# Patient Record
Sex: Male | Born: 1955 | Race: White | Hispanic: No | Smoking: Never smoker
Health system: Southern US, Community
[De-identification: ages and names within clinical notes are randomized; demographics above are authoritative.]

## PROBLEM LIST (undated history)

## (undated) DIAGNOSIS — C9110 Chronic lymphocytic leukemia of B-cell type not having achieved remission: Principal | ICD-10-CM

## (undated) DIAGNOSIS — C9590 Leukemia, unspecified not having achieved remission: Secondary | ICD-10-CM

## (undated) HISTORY — PX: OTHER SURGICAL HISTORY: SHX169

## (undated) HISTORY — DX: Leukemia, unspecified not having achieved remission: C95.90

## (undated) HISTORY — DX: Chronic lymphocytic leukemia of B-cell type not having achieved remission: C91.10

---

## 2004-08-10 ENCOUNTER — Encounter: Admission: RE | Admit: 2004-08-10 | Discharge: 2004-08-10 | Payer: Self-pay | Admitting: Internal Medicine

## 2005-07-01 ENCOUNTER — Encounter: Admission: RE | Admit: 2005-07-01 | Discharge: 2005-07-01 | Payer: Self-pay | Admitting: Internal Medicine

## 2007-02-19 ENCOUNTER — Ambulatory Visit: Payer: Self-pay | Admitting: Internal Medicine

## 2007-03-09 DIAGNOSIS — J309 Allergic rhinitis, unspecified: Secondary | ICD-10-CM | POA: Insufficient documentation

## 2007-03-09 DIAGNOSIS — F411 Generalized anxiety disorder: Secondary | ICD-10-CM | POA: Insufficient documentation

## 2007-03-09 DIAGNOSIS — I10 Essential (primary) hypertension: Secondary | ICD-10-CM | POA: Insufficient documentation

## 2007-03-09 DIAGNOSIS — K7689 Other specified diseases of liver: Secondary | ICD-10-CM | POA: Insufficient documentation

## 2007-03-09 DIAGNOSIS — K219 Gastro-esophageal reflux disease without esophagitis: Secondary | ICD-10-CM

## 2007-03-24 ENCOUNTER — Ambulatory Visit: Payer: Self-pay | Admitting: Internal Medicine

## 2007-03-24 ENCOUNTER — Encounter: Payer: Self-pay | Admitting: Internal Medicine

## 2007-10-21 IMAGING — CT CT ABDOMEN W/O CM
2 of 3 series · 14 of 32 positions shown, 19 images · IV contrast (agent unspecified)
Comparison: none

CLINICAL DATA: Hematuria.  Question stone.
ABDOMEN CT WITHOUT CONTRAST:
TECHNIQUE: Multidetector CT imaging of the abdomen was performed following the standard protocol without IV contrast.
There is no hydronephrosis.  There are no infrarenal calculi.  The abdominal aorta is normal in size.  There is no adenopathy.  The visualized visceral structures (as visualized on this unenhanced study) have a normal appearance.
TECHNIQUE: Multidetector CT imaging of the pelvis was performed following the standard protocol without IV contrast.
There is a 2 mm size stone located at the left ureterovesical junction.  This is not associated with significant hydronephrosis.  There is no pelvic mass or adenopathy.  There is mild sigmoid diverticulosis noted.  There is mild prostatic calcification present.

[Series 2: renal stone · axial · 0.76mm/px · z∈[-273,-33]mm · 6 of 68 slices shown, 11 images]
[im 10/68  soft-tissue]
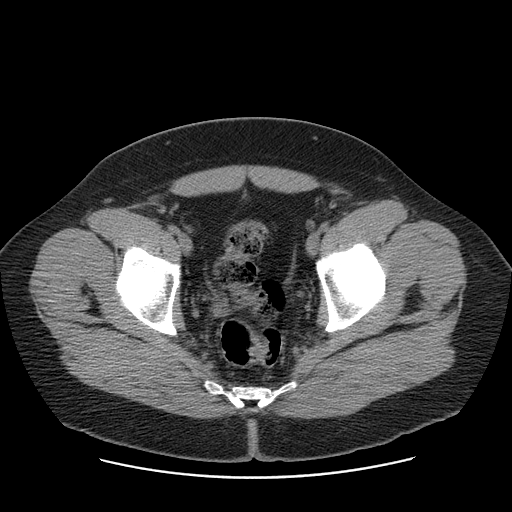
[im 10/68  bone]
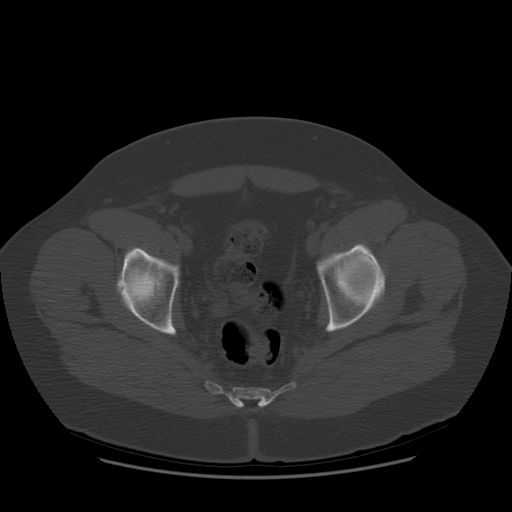
[im 20/68  soft-tissue]
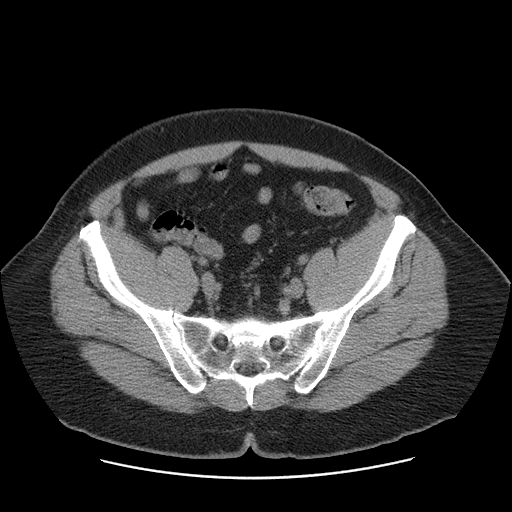
[im 29/68  soft-tissue]
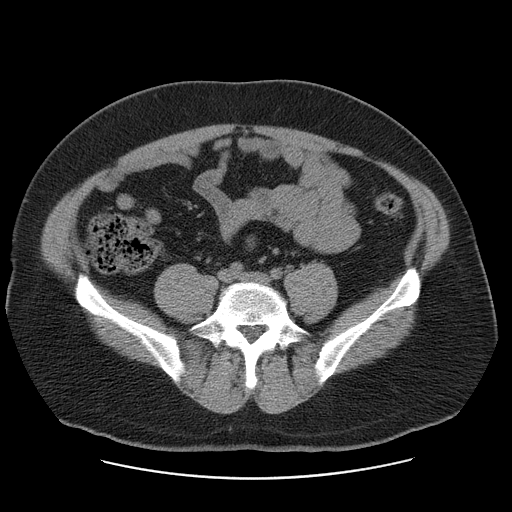
[im 29/68  lung]
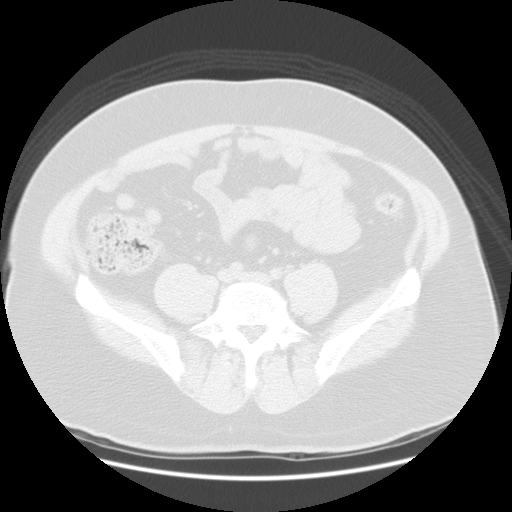
[im 39/68  soft-tissue]
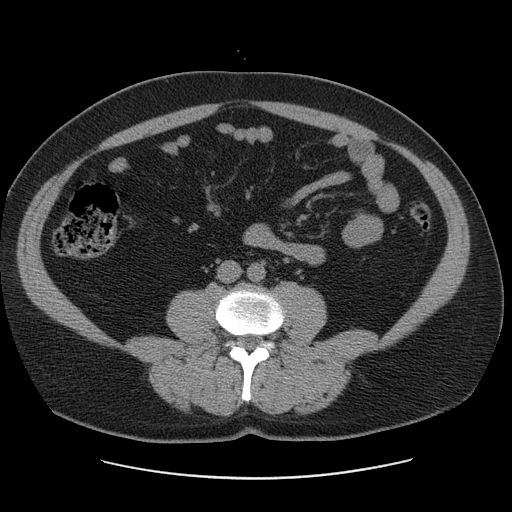
[im 39/68  lung]
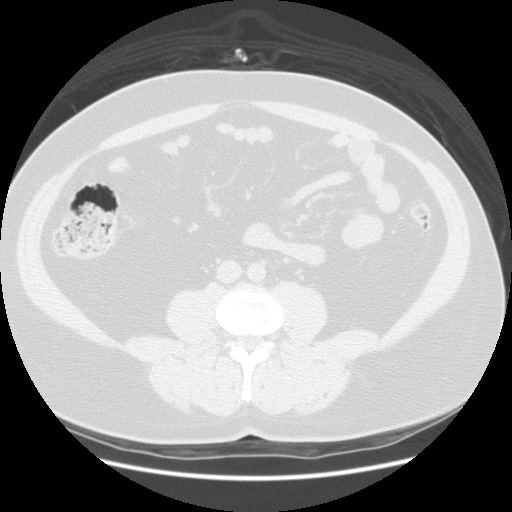
[im 48/68  soft-tissue]
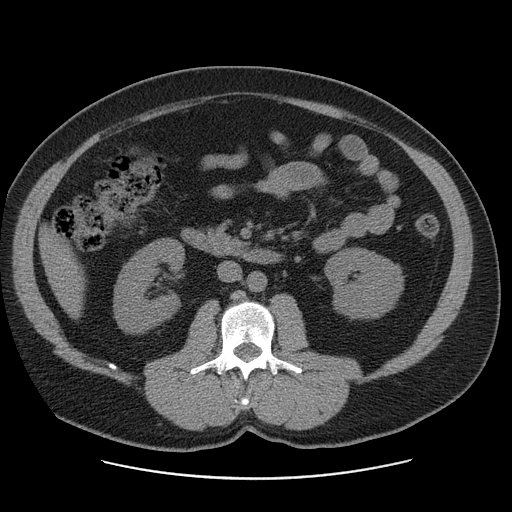
[im 48/68  lung]
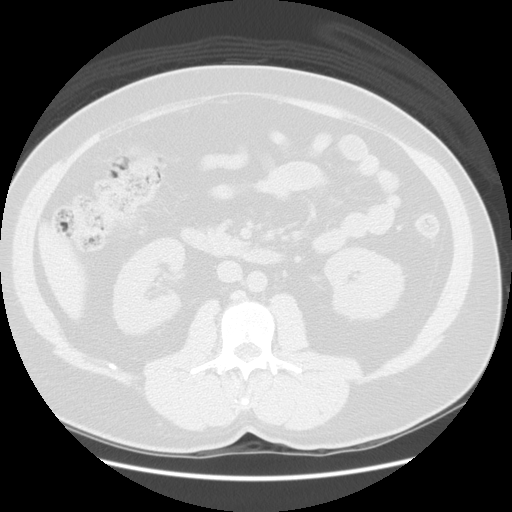
[im 58/68  soft-tissue]
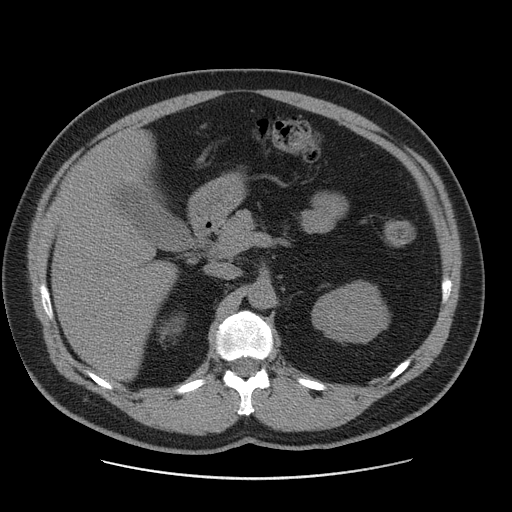
[im 58/68  lung]
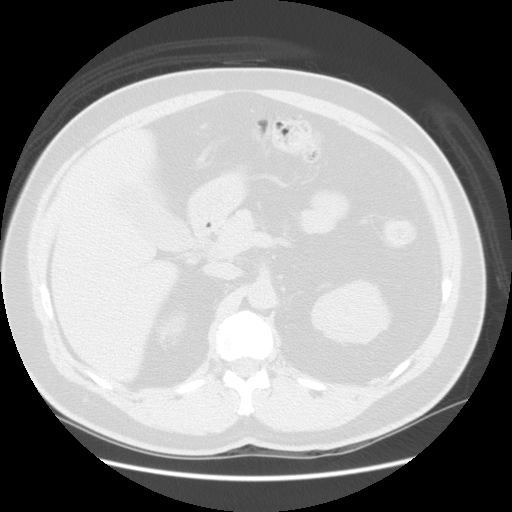

[Series 103: reformatted · sagittal · 0.76mm/px · 8 of 118 slices shown]
[im 10/118  soft-tissue]
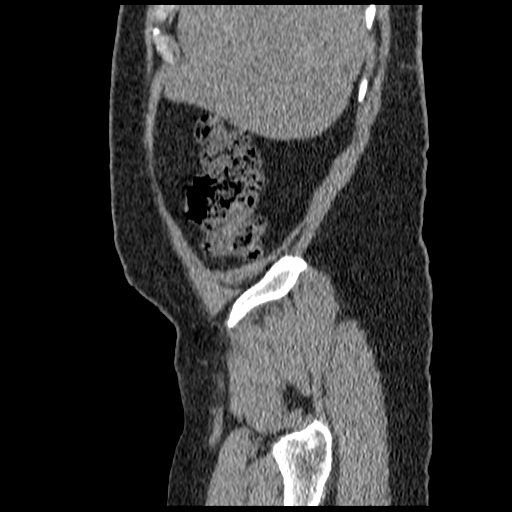
[im 28/118  soft-tissue]
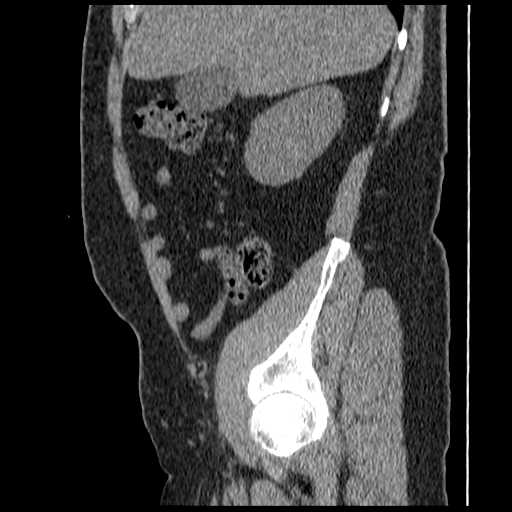
[im 37/118  soft-tissue]
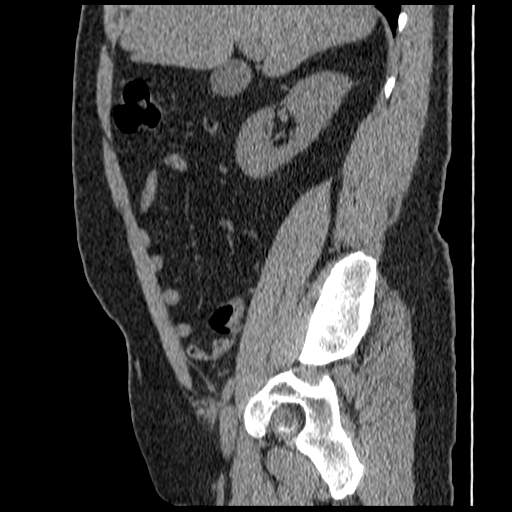
[im 55/118  soft-tissue]
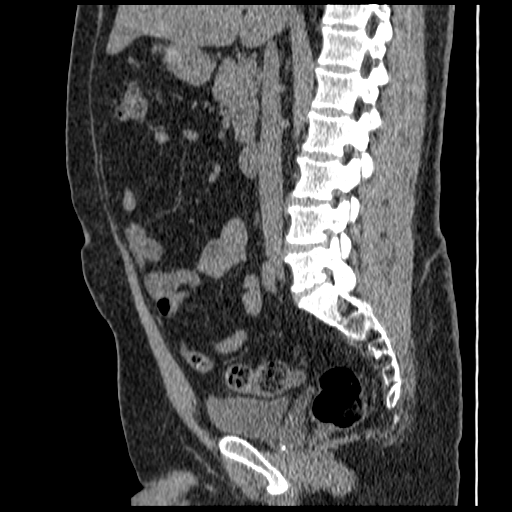
[im 64/118  soft-tissue]
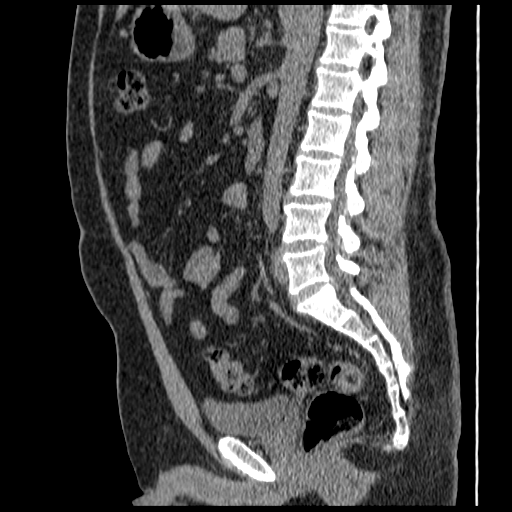
[im 82/118  soft-tissue]
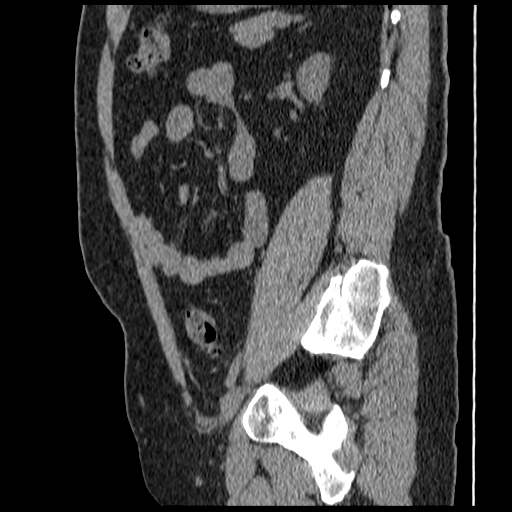
[im 91/118  soft-tissue]
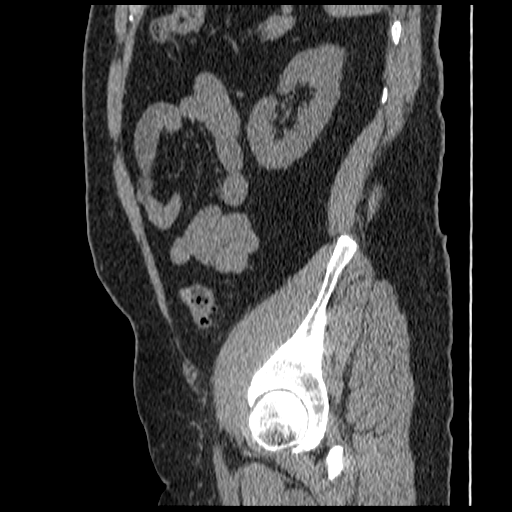
[im 109/118  soft-tissue]
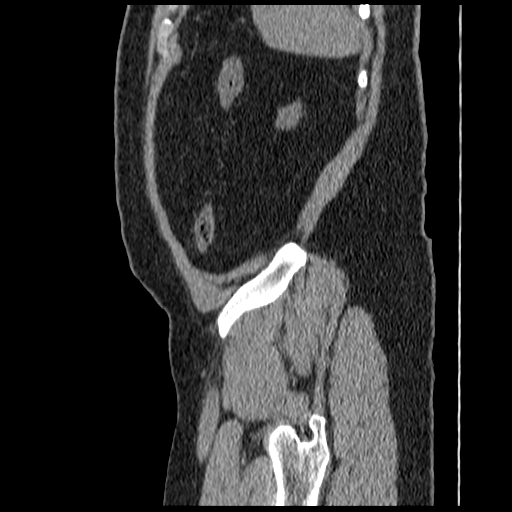

[14 of 32 positions shown; findings below may reference images not displayed]

IMPRESSION: Negative CT of the abdomen without contrast.
PELVIS CT WITHOUT CONTRAST:
IMPRESSION: 2 mm in size left ureteral stone located at the level of the left ureterovesicle junction.  No significant associated hydronephrosis.

## 2010-07-24 NOTE — Assessment & Plan Note (Signed)
[facility]                         GASTROENTEROLOGY OFFICE NOTE   NAME:[name], [name] ATTWOOD                        MRN:          [id number]  DATE:02/19/2007                            DOB:          December 14, 1955    SUBJECTIVE:  [name] [name] is a very nice 55 year old gentleman who is  referred by Dr. Ludwig Clarks for evaluation of dyspepsia and change in bowel  habits.  Dr. Ludwig Clarks has been following him for hypertension, allergic  rhinitis and known fatty liver which showed up on abdominal ultrasound  about a year ago.  He works night shift starting at 6:00 P.M. and coming  home around 3 or 4 in the morning. His eating habits have been also  irregular between Monday's and Friday's.  His schedule has been  irregular for the past many years.  He has gained about 10 to 15 pounds  in the last couple of years.  He experiences bloating and distention in  the upper abdomen almost immediately as he starts to eat.  He has had  acid reflux for at least 20 years and used to take Pepcid and later on  Prilosec and Prevacid with some improvement.  He is currently on  Protonix which has seemed to help.  He denies drinking excess alcohol  and he denies smoking.  He has taken aspirin 81 mg daily but no other  NSAID's.  The patient did have a flexible sigmoidoscopy about 10 years  ago.  There is no family history of colon cancer but his father had  diverticulosis.   MEDICATIONS:  Protonix 40 mg p.o. daily.   PAST MEDICAL HISTORY:  1. High blood pressure.  2. Anxiety.  3. Allergies.  4. Sinus problems.   PAST SURGICAL HISTORY:  None.   FAMILY HISTORY:  Positive for diabetes in mother.  Heart disease in both  parents.   SOCIAL HISTORY:  He is widowed. He has a 34 year old daughter.  He does  not smoke. He drinks alcohol socially.   REVIEW OF SYSTEMS:  Weight gain.  Allergies.   PHYSICAL EXAMINATION:  VITAL SIGNS:  Blood pressure 132/80. Pulse 72.  Weight 209 pounds.  GENERAL APPEARANCE:  He is mildly overweight, in no distress.  HEENT:  Sclerae nonicteric.  NECK:  Supple with no adenopathy.  LUNGS:  Clear to auscultation.  No wheezes or rales.  CARDIOVASCULAR:  Normal S1 and S2.  ABDOMEN:  Somewhat protuberant, soft, tender in epigastrium and midline  above the umbilicus.  No tenderness in lower abdomen.  Liver edge at  costal margin.  RECTAL:  Exam is normal rectal tone.  Stool was soft, Hemoccult  negative.   LABORATORY DATA:  Laboratory data from Dr. Jacqulyn Bath office on November 19, 2006 shows normal liver function tests and slight hyperlipidemia.   IMPRESSION:  28. A 55 year old white male with dyspepsia, bloating and longstanding      history of gastroesophageal reflux disease.  His symptoms are      aggravated by irregular lifestyle as well as by stress as a single      parent.  His eating habits  have not been modified to diminish his      gastroesophageal reflux.  He does have documented fatty liver which      may be related to his hyperlipidemia.  2. Irregular bowel habits, probably irritable bowel syndrome, rule out      symptomatic diverticulosis, rule out less likely inflammatory bowel      disease.   PLAN:  1. We have spoken about high-fiber, low fat diet and increased      exercise and generally improvement of his lifestyle.  2. In order to rule out Barrett's esophagus he will need to undergo      upper endoscopy and appropriate biopsies.  3. Tissue transglutaminase levels to rule out celiac sprue.  4. Colonoscopy for evaluation of his lower gastrointestinal symptoms.      He is 55 years old and we would recommend colorectal screening at      his age.  5. The patient will need to find someone to provide transportation for      him since he does not really have any close relatives or friends      who could provide transportation.  6. He will increase his Protonix to 40 mg p.o. b.i.d. and will add      Bentyl 10 mg p.o.  b.i.d.     Hedwig Morton. Juanda Chance, MD  Electronically Signed    DMB/MedQ  DD: 02/19/2007  DT: 02/20/2007  Job #: [id number]   cc:   Ralene Ok, M.D.

## 2013-06-27 ENCOUNTER — Encounter (HOSPITAL_COMMUNITY): Payer: Self-pay | Admitting: Emergency Medicine

## 2013-06-27 ENCOUNTER — Emergency Department (HOSPITAL_COMMUNITY)
Admission: EM | Admit: 2013-06-27 | Discharge: 2013-06-27 | Disposition: A | Discharge status: Home / Self Care | Payer: 59 | Source: Home / Self Care | Attending: Emergency Medicine | Admitting: Emergency Medicine

## 2013-06-27 DIAGNOSIS — J329 Chronic sinusitis, unspecified: Secondary | ICD-10-CM

## 2013-06-27 MED ORDER — AMOXICILLIN 500 MG PO TABS: 500.0000 mg | ORAL_TABLET | Freq: Two times a day (BID) | ORAL | Status: DC

## 2013-06-27 NOTE — Discharge Instructions (Signed)

## 2013-06-27 NOTE — ED Provider Notes (Signed)
CSN: [id number]     Arrival date & time 06/27/13  1803 History   First MD Initiated Contact with Patient 06/27/13 1939     Chief Complaint  Patient presents with  . URI  . Facial Pain   (Consider location/radiation/quality/duration/timing/severity/associated sxs/prior Treatment) Patient is a 58 y.o. male presenting with URI. The history is provided by the patient.  URI Presenting symptoms: congestion, cough and sore throat   Presenting symptoms: no ear pain, no fever and no rhinorrhea   Severity:  Moderate Onset quality:  Gradual Duration:  8 days Timing:  Constant Progression:  Worsening Chronicity:  New Relieved by:  Nothing Worsened by:  Nothing tried Ineffective treatments:  OTC medications Associated symptoms: sinus pain     History reviewed. No pertinent past medical history. History reviewed. No pertinent past surgical history. History reviewed. No pertinent family history. History  Substance Use Topics  . Smoking status: Not on file  . Smokeless tobacco: Not on file  . Alcohol Use: Not on file    Review of Systems  Constitutional: Positive for chills. Negative for fever.  HENT: Positive for congestion, postnasal drip, sinus pressure and sore throat. Negative for ear pain and rhinorrhea.   Respiratory: Positive for cough.     Allergies  Review of patient's allergies indicates not on file.  Home Medications   Prior to Admission medications   Medication Sig Start Date End Date Taking? Authorizing Provider  aspirin 81 MG chewable tablet Chew by mouth daily.   Yes Historical Provider, MD  benazepril (LOTENSIN) 40 MG tablet Take 40 mg by mouth daily.   Yes Historical Provider, MD  amoxicillin (AMOXIL) 500 MG tablet Take 1 tablet (500 mg total) by mouth 2 (two) times daily. 06/27/13   Carvel Getting, NP   BP 178/85  Pulse 94  Temp(Src) 98.6 F (37 C) (Oral)  Resp 15  SpO2 96% Physical Exam  Constitutional: He appears well-developed and well-nourished. He  appears ill. No distress.  HENT:  Right Ear: Tympanic membrane, external ear and ear canal normal.  Left Ear: Tympanic membrane, external ear and ear canal normal.  Nose: Mucosal edema present. No rhinorrhea. Right sinus exhibits maxillary sinus tenderness. Right sinus exhibits no frontal sinus tenderness. Left sinus exhibits maxillary sinus tenderness. Left sinus exhibits no frontal sinus tenderness.  Mouth/Throat: Oropharynx is clear and moist and mucous membranes are normal.  Cardiovascular: Normal rate and regular rhythm.   Pulmonary/Chest: Effort normal and breath sounds normal.    ED Course  Procedures (including critical care time) Labs Review Labs Reviewed - No data to display  Results for orders placed in visit on 02/19/07  CONVERTED [facility] LAB      Result Value Ref Range   Tissue Transglutaminase Ab, IgA 1.0 U/ML  <7 units   Imaging Review No results found.   MDM   1. Sinusitis   rx amoxicillin 500mg  BID #20.      Carvel Getting, NP 06/27/13 1945

## 2013-06-27 NOTE — ED Notes (Signed)
Pt comes in with c/o nasal congestion with post nasal drip that started x 1week unrelieved by OTC generic Thera-Flu and cough medicine States sx are worsening with chest congestion. Denies n/v or fevers

## 2013-06-28 NOTE — ED Provider Notes (Signed)
Medical screening examination/treatment/procedure(s) were performed by non-physician practitioner and as supervising physician I was immediately available for consultation/collaboration.  Philipp Deputy, M.D.  Harden Mo, MD 06/28/13 1125

## 2013-08-12 ENCOUNTER — Telehealth: Payer: Self-pay | Admitting: Hematology and Oncology

## 2013-08-12 NOTE — Telephone Encounter (Signed)
S/W PATIENT AND GVE NP APPT FOR 06/22 @ 11 W/DR. [place]. REFERRING DR. Jilda Panda DX- SEVER [employer] WELCOME PACKET MAILED.

## 2013-08-30 ENCOUNTER — Encounter (INDEPENDENT_AMBULATORY_CARE_PROVIDER_SITE_OTHER): Payer: Self-pay

## 2013-08-30 ENCOUNTER — Ambulatory Visit: Payer: 59

## 2013-08-30 ENCOUNTER — Telehealth: Payer: Self-pay | Admitting: Hematology and Oncology

## 2013-08-30 ENCOUNTER — Ambulatory Visit (HOSPITAL_BASED_OUTPATIENT_CLINIC_OR_DEPARTMENT_OTHER): Payer: 59 | Admitting: Hematology and Oncology

## 2013-08-30 ENCOUNTER — Encounter: Payer: Self-pay | Admitting: Hematology and Oncology

## 2013-08-30 VITALS — BP 164/82 | HR 73 | Temp 98.2°F | Resp 19 | Ht 66.0 in | Wt 219.1 lb

## 2013-08-30 DIAGNOSIS — L989 Disorder of the skin and subcutaneous tissue, unspecified: Secondary | ICD-10-CM

## 2013-08-30 DIAGNOSIS — C9110 Chronic lymphocytic leukemia of B-cell type not having achieved remission: Secondary | ICD-10-CM | POA: Insufficient documentation

## 2013-08-30 HISTORY — DX: Chronic lymphocytic leukemia of B-cell type not having achieved remission: C91.10

## 2013-08-30 NOTE — Telephone Encounter (Signed)
gv adn printed appts ched and avs for pt for DEC °

## 2013-08-30 NOTE — Assessment & Plan Note (Signed)
Likely sole of keratosis. I recommend avoidance of excessive sun exposure and to wears sunscreen regularly.

## 2013-08-30 NOTE — Progress Notes (Signed)
[facility] NOTE  Patient Care Team: Jilda Panda, MD as PCP - General (Internal Medicine)  CHIEF COMPLAINTS/PURPOSE OF CONSULTATION:  CLL  HISTORY OF PRESENTING ILLNESS:  [name] [name] 58 y.o. male is here because of recent diagnosis of CLL. The patient had routine blood work was noted to have lymphocytosis. His total white count was 11.1 with predominant lymphocytosis. Flow cytometry detected hormonal cells which stained positive for CD5, CD19, CD20 and CD23. He denies lymphadenopathy. He has no anemia, thrombocytopenia, enlarged lymph nodes or spleen. He denies a history of recurrent infections. He was placed on antibiotics 2 months ago for upper respiratory tract infections. The patient has chronic skin lesions consult dermatologist in the past but was never diagnosed of skin cancer.   MEDICAL HISTORY:  Past Medical History  Diagnosis Date  . Leukemia   . 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 Kidney stone      SOCIAL HISTORY: History   Social History  . Marital Status: Widowed    Spouse Name: N/A    Number of Children: N/A  . Years of Education: N/A   Occupational History  . Not on file.   Social History Main Topics  . Smoking status: Never Smoker   . Smokeless tobacco: Never Used  . Alcohol Use: No  . Drug Use: No  . Sexual Activity: Not on file   Other Topics Concern  . Not on file   Social History Narrative  . No narrative on file    FAMILY HISTORY: Family History  Problem Relation Age of Onset  . Cancer Paternal Grandfather     unknown cancer  . Cancer Cousin     breast ca  . Cancer Cousin     lung ca    ALLERGIES:  has No Known Allergies.  MEDICATIONS:  Current Outpatient Prescriptions  Medication Sig Dispense Refill  . aspirin 81 MG chewable tablet Chew by mouth daily.      . benazepril (LOTENSIN) 40 MG tablet Take 40 mg by mouth  daily.      . CRESTOR 5 MG tablet Take 5 mg by mouth daily.       No current facility-administered medications for this visit.    REVIEW OF SYSTEMS:   Constitutional: Denies fevers, chills or abnormal night sweats Eyes: Denies blurriness of vision, double vision or watery eyes Ears, nose, mouth, throat, and face: Denies mucositis or sore throat Respiratory: Denies cough, dyspnea or wheezes Cardiovascular: Denies palpitation, chest discomfort or lower extremity swelling Gastrointestinal:  Denies nausea, heartburn or change in bowel habits Skin: Denies abnormal skin rashes Lymphatics: Denies new lymphadenopathy or easy bruising Neurological:Denies numbness, tingling or new weaknesses Behavioral/Psych: Mood is stable, no new changes  All other systems were reviewed with the patient and are negative.  PHYSICAL EXAMINATION: ECOG PERFORMANCE STATUS: 0 - Asymptomatic  Filed Vitals:   08/30/13 1143  BP: 164/82  Pulse: 73  Temp: 98.2 F (36.8 C)  Resp: 19   Filed Weights   08/30/13 1143  Weight: 219 lb 1.6 oz (99.383 kg)    GENERAL:alert, no distress and comfortable SKIN: Multiple patches of erythematous, papular rash on his sun exposed areas. EYES: normal, conjunctiva are pink and non-injected, sclera clear OROPHARYNX:no exudate, no erythema and lips, buccal mucosa, and tongue normal  NECK: supple, thyroid normal size, non-tender, without nodularity LYMPH:  no palpable lymphadenopathy in the cervical, axillary or inguinal LUNGS:  clear to auscultation and percussion with normal breathing effort HEART: regular rate & rhythm and no murmurs and no lower extremity edema ABDOMEN:abdomen soft, non-tender and normal bowel sounds Musculoskeletal:no cyanosis of digits and no clubbing  PSYCH: alert & oriented x 3 with fluent speech NEURO: no focal motor/sensory deficits  LABORATORY DATA:  I have reviewed the data as listed ASSESSMENT & PLAN:  CLL (chronic lymphocytic  leukemia) Clinically, he has RAI stage 0. I printed patient education material. We discussed about the role of vaccination programs to prevent infection. I recommend history, physical examination and repeat blood work in 6 months. One week prior to his return blood work, I will order an additional workup including FISH analysis to understand the behavior of his CLL. I explained to the patient the reason of not treating his disease now. I discussed with the patient natural history of CLL.  Skin lesions, generalized Likely sole of keratosis. I recommend avoidance of excessive sun exposure and to wears sunscreen regularly.     Orders Placed This Encounter  Procedures  . Lactate dehydrogenase    Standing Status: Future     Number of Occurrences:      Standing Expiration Date: 08/30/2014  . IgG, IgA, IgM    Standing Status: Future     Number of Occurrences:      Standing Expiration Date: 08/30/2014  . CBC with Differential    Standing Status: Future     Number of Occurrences:      Standing Expiration Date: 08/30/2014  . Comprehensive metabolic panel    Standing Status: Future     Number of Occurrences:      Standing Expiration Date: 08/30/2014  . FISH, Peripheral Blood    CLL    Standing Status: Future     Number of Occurrences:      Standing Expiration Date: 08/30/2014    All questions were answered. The patient knows to call the clinic with any problems, questions or concerns. I spent 40 minutes counseling the patient face to face. The total time spent in the appointment was 55 minutes and more than 50% was on counseling.     [facility], [place], MD 08/30/2013 9:27 PM

## 2013-08-30 NOTE — Progress Notes (Signed)
Checked in new pt with no financial concerns. °

## 2013-08-30 NOTE — Assessment & Plan Note (Addendum)
Clinically, he has RAI stage 0. I printed patient education material. We discussed about the role of vaccination programs to prevent infection. I recommend history, physical examination and repeat blood work in 6 months. One week prior to his return blood work, I will order an additional workup including FISH analysis to understand the behavior of his CLL. I explained to the patient the reason of not treating his disease now. I discussed with the patient natural history of CLL.

## 2014-02-14 ENCOUNTER — Other Ambulatory Visit (HOSPITAL_COMMUNITY)
Admission: RE | Admit: 2014-02-14 | Discharge: 2014-02-14 | Disposition: A | Discharge status: Home / Self Care | Payer: 59 | Source: Ambulatory Visit | Attending: Hematology and Oncology | Admitting: Hematology and Oncology

## 2014-02-14 ENCOUNTER — Other Ambulatory Visit (HOSPITAL_BASED_OUTPATIENT_CLINIC_OR_DEPARTMENT_OTHER): Payer: 59

## 2014-02-14 DIAGNOSIS — C9110 Chronic lymphocytic leukemia of B-cell type not having achieved remission: Secondary | ICD-10-CM | POA: Insufficient documentation

## 2014-02-14 LAB — COMPREHENSIVE METABOLIC PANEL (CC13)
ALBUMIN: 4.1 g/dL (ref 3.5–5.0)
ALK PHOS: 64 U/L (ref 40–150)
ALT: 34 U/L (ref 0–55)
AST: 27 U/L (ref 5–34)
Anion Gap: 10 mEq/L (ref 3–11)
BILIRUBIN TOTAL: 0.58 mg/dL (ref 0.20–1.20)
BUN: 10.7 mg/dL (ref 7.0–26.0)
CALCIUM: 9.6 mg/dL (ref 8.4–10.4)
CO2: 26 mEq/L (ref 22–29)
CREATININE: 1 mg/dL (ref 0.7–1.3)
Chloride: 102 mEq/L (ref 98–109)
EGFR: 79 mL/min/{1.73_m2} — AB (ref >90)
Glucose: 112 mg/dl (ref 70–140)
POTASSIUM: 4.2 meq/L (ref 3.5–5.1)
SODIUM: 139 meq/L (ref 136–145)
Total Protein: 7.3 g/dL (ref 6.4–8.3)

## 2014-02-14 LAB — CBC WITH DIFFERENTIAL/PLATELET
BASO%: 0.3 % (ref 0.0–2.0)
Basophils Absolute: 0 10*3/uL (ref 0.0–0.1)
EOS ABS: 0.1 10*3/uL (ref 0.0–0.5)
EOS%: 0.8 % (ref 0.0–7.0)
HCT: 43.7 % (ref 38.4–49.9)
HGB: 14.2 g/dL (ref 13.0–17.1)
LYMPH%: 68.8 % — ABNORMAL HIGH (ref 14.0–49.0)
MCH: 29.5 pg (ref 27.2–33.4)
MCHC: 32.5 g/dL (ref 32.0–36.0)
MCV: 90.5 fL (ref 79.3–98.0)
MONO#: 0.6 10*3/uL (ref 0.1–0.9)
MONO%: 4 % (ref 0.0–14.0)
NEUT%: 26.1 % — ABNORMAL LOW (ref 39.0–75.0)
NEUTROS ABS: 3.6 10*3/uL (ref 1.5–6.5)
PLATELETS: 280 10*3/uL (ref 140–400)
RBC: 4.83 10*6/uL (ref 4.20–5.82)
RDW: 13.2 % (ref 11.0–14.6)
WBC: 13.9 10*3/uL — AB (ref 4.0–10.3)
lymph#: 9.6 10*3/uL — ABNORMAL HIGH (ref 0.9–3.3)

## 2014-02-14 LAB — TECHNOLOGIST REVIEW

## 2014-02-14 LAB — LACTATE DEHYDROGENASE (CC13): LDH: 177 U/L (ref 125–245)

## 2014-02-15 LAB — IGG, IGA, IGM
IGM, SERUM: 84 mg/dL (ref 41–251)
IgA: 374 mg/dL (ref 68–379)
IgG (Immunoglobin G), Serum: 1060 mg/dL (ref 650–1600)

## 2014-02-18 LAB — TISSUE HYBRIDIZATION TO NCBH

## 2014-02-21 ENCOUNTER — Ambulatory Visit (HOSPITAL_BASED_OUTPATIENT_CLINIC_OR_DEPARTMENT_OTHER): Payer: 59 | Admitting: Hematology and Oncology

## 2014-02-21 ENCOUNTER — Encounter: Payer: Self-pay | Admitting: Hematology and Oncology

## 2014-02-21 ENCOUNTER — Telehealth: Payer: Self-pay | Admitting: Hematology and Oncology

## 2014-02-21 VITALS — BP 146/68 | HR 76 | Temp 98.0°F | Resp 19 | Ht 66.0 in | Wt 221.3 lb

## 2014-02-21 DIAGNOSIS — C9110 Chronic lymphocytic leukemia of B-cell type not having achieved remission: Secondary | ICD-10-CM

## 2014-02-21 NOTE — Assessment & Plan Note (Signed)
Clinically, he has no signs of disease progression. He will remain at stage 0. I will see him next year in 9 months with history, physical examination and blood work. If his blood will remain stable, I will see him on a yearly visit.

## 2014-02-21 NOTE — Progress Notes (Signed)
[facility] OFFICE PROGRESS NOTE  Patient Care Team: Jilda Panda, MD as PCP - General (Internal Medicine)  SUMMARY OF ONCOLOGIC HISTORY:   CLL (chronic lymphocytic leukemia)   08/30/2013 Initial Diagnosis CLL (chronic lymphocytic leukemia)   02/15/2014 Pathology Results FISH for CLL was negative    INTERVAL HISTORY: Please see below for problem oriented charting. He feels fine. Denies new lymphadenopathy. No recent infection.  REVIEW OF SYSTEMS:   Constitutional: Denies fevers, chills or abnormal weight loss Eyes: Denies blurriness of vision Ears, nose, mouth, throat, and face: Denies mucositis or sore throat Respiratory: Denies cough, dyspnea or wheezes Cardiovascular: Denies palpitation, chest discomfort or lower extremity swelling Gastrointestinal:  Denies nausea, heartburn or change in bowel habits Skin: Denies abnormal skin rashes Lymphatics: Denies new lymphadenopathy or easy bruising Neurological:Denies numbness, tingling or new weaknesses Behavioral/Psych: Mood is stable, no new changes  All other systems were reviewed with the patient and are negative.  I have reviewed the past medical history, past surgical history, social history and family history with the patient and they are unchanged from previous note.  ALLERGIES:  has No Known Allergies.  MEDICATIONS:  Current Outpatient Prescriptions  Medication Sig Dispense Refill  . aspirin 81 MG chewable tablet Chew by mouth daily.    . benazepril (LOTENSIN) 40 MG tablet Take 40 mg by mouth daily.    . cholecalciferol (VITAMIN D) 1000 UNITS tablet Take 1,000 Units by mouth daily.    . CRESTOR 5 MG tablet Take 5 mg by mouth daily.    . Multiple Vitamin (MULTIVITAMIN) tablet Take 1 tablet by mouth daily.    . Zinc Sulfate (ZINC 15 PO) Take by mouth daily.     No current facility-administered medications for this visit.    PHYSICAL EXAMINATION: ECOG PERFORMANCE STATUS: 0 - Asymptomatic  Filed Vitals:   02/21/14 1459  BP: 146/68  Pulse: 76  Temp: 98 F (36.7 C)  Resp: 19   Filed Weights   02/21/14 1459  Weight: 221 lb 4.8 oz (100.381 kg)    GENERAL:alert, no distress and comfortable. He is obese. SKIN: skin color, texture, turgor are normal, no rashes or significant lesions EYES: normal, Conjunctiva are pink and non-injected, sclera clear OROPHARYNX:no exudate, no erythema and lips, buccal mucosa, and tongue normal  NECK: supple, thyroid normal size, non-tender, without nodularity LYMPH:  no palpable lymphadenopathy in the cervical, axillary or inguinal LUNGS: clear to auscultation and percussion with normal breathing effort HEART: regular rate & rhythm and no murmurs and no lower extremity edema ABDOMEN:abdomen soft, non-tender and normal bowel sounds Musculoskeletal:no cyanosis of digits and no clubbing  NEURO: alert & oriented x 3 with fluent speech, no focal motor/sensory deficits  LABORATORY DATA:  I have reviewed the data as listed    Component Value Date/Time   NA 139 02/14/2014 1438   K 4.2 02/14/2014 1438   CO2 26 02/14/2014 1438   GLUCOSE 112 02/14/2014 1438   BUN 10.7 02/14/2014 1438   CREATININE 1.0 02/14/2014 1438   CALCIUM 9.6 02/14/2014 1438   PROT 7.3 02/14/2014 1438   ALBUMIN 4.1 02/14/2014 1438   AST 27 02/14/2014 1438   ALT 34 02/14/2014 1438   ALKPHOS 64 02/14/2014 1438   BILITOT 0.58 02/14/2014 1438    No results found for: SPEP, UPEP  Lab Results  Component Value Date   WBC 13.9* 02/14/2014   NEUTROABS 3.6 02/14/2014   HGB 14.2 02/14/2014   HCT 43.7 02/14/2014   MCV 90.5 02/14/2014  PLT 280 02/14/2014      Chemistry      Component Value Date/Time   NA 139 02/14/2014 1438   K 4.2 02/14/2014 1438   CO2 26 02/14/2014 1438   BUN 10.7 02/14/2014 1438   CREATININE 1.0 02/14/2014 1438      Component Value Date/Time   CALCIUM 9.6 02/14/2014 1438   ALKPHOS 64 02/14/2014 1438   AST 27 02/14/2014 1438   ALT 34 02/14/2014 1438   BILITOT  0.58 02/14/2014 1438      ASSESSMENT & PLAN:  CLL (chronic lymphocytic leukemia) Clinically, he has no signs of disease progression. He will remain at stage 0. I will see him next year in 9 months with history, physical examination and blood work. If his blood will remain stable, I will see him on a yearly visit.   Orders Placed This Encounter  Procedures  . CBC with Differential    Standing Status: Future     Number of Occurrences:      Standing Expiration Date: 03/28/2015  . Comprehensive metabolic panel    Standing Status: Future     Number of Occurrences:      Standing Expiration Date: 03/28/2015  . Lactate dehydrogenase    Standing Status: Future     Number of Occurrences:      Standing Expiration Date: 03/28/2015   All questions were answered. The patient knows to call the clinic with any problems, questions or concerns. No barriers to learning was detected. I spent 15 minutes counseling the patient face to face. The total time spent in the appointment was 20 minutes and more than 50% was on counseling and review of test results     [facility], [place], MD 02/21/2014 3:12 PM

## 2014-02-21 NOTE — Telephone Encounter (Signed)
Gave avs & cal for Sept 2016. °

## 2014-03-10 LAB — FISH, PERIPHERAL BLOOD

## 2014-03-21 ENCOUNTER — Encounter (HOSPITAL_BASED_OUTPATIENT_CLINIC_OR_DEPARTMENT_OTHER): Payer: 59

## 2014-04-11 ENCOUNTER — Ambulatory Visit (HOSPITAL_BASED_OUTPATIENT_CLINIC_OR_DEPARTMENT_OTHER): Payer: 59 | Attending: Internal Medicine | Admitting: Radiology

## 2014-04-11 DIAGNOSIS — G4710 Hypersomnia, unspecified: Secondary | ICD-10-CM | POA: Diagnosis not present

## 2014-04-11 DIAGNOSIS — G4730 Sleep apnea, unspecified: Secondary | ICD-10-CM | POA: Diagnosis not present

## 2014-04-11 DIAGNOSIS — R0683 Snoring: Secondary | ICD-10-CM | POA: Diagnosis present

## 2014-04-23 DIAGNOSIS — R0683 Snoring: Secondary | ICD-10-CM

## 2014-04-23 DIAGNOSIS — G4710 Hypersomnia, unspecified: Secondary | ICD-10-CM

## 2014-04-23 DIAGNOSIS — G4730 Sleep apnea, unspecified: Secondary | ICD-10-CM

## 2014-04-23 NOTE — Sleep Study (Signed)
   NAME: [name] [name] DATE OF BIRTH:  03/11/1956 MEDICAL RECORD NUMBER [id number]  LOCATION: [facility]  PHYSICIAN: Dreydon Cardenas D  DATE OF STUDY: 04/11/2014  SLEEP STUDY TYPE: Nocturnal Polysomnogram               REFERRING PHYSICIAN: Jilda Panda, MD  INDICATION FOR STUDY: Hypersomnia with sleep apnea  EPWORTH SLEEPINESS SCORE:   8/24 HEIGHT:   5 feet 6 inches WEIGHT:   215 pounds BMI 35 NECK SIZE: 16 in.  MEDICATIONS: Charted for review   SLEEP ARCHITECTURE:  total sleep time 148 minutes with sleep efficiency 40.9%. Stage I was 35.5%, stage II 61.5%, stage III absent, REM 3% of total sleep time. Sleep latency 34.5 minutes, REM latency 196 minutes, awake after sleep onset 168 minutes, arousal index 50.7. Significant difficulty maintaining sleep.  RESPIRATORY DATA: Apnea hypopnea index (AHI) 7.7 per hour. 19 total events scored including 7 obstructive apneas and 12 hypopneas. Events were more common while supine. REM AHI 40 per hour. There were not enough events to permit application of split protocol CPAP titration.  OXYGEN DATA: Very loud snoring with oxygen desaturation to a nadir of 86% and mean saturation 93.6% on room air  CARDIAC DATA: Normal sinus rhythm  MOVEMENT/PARASOMNIA: No significant movement disturbance, bathroom 1  IMPRESSION/ RECOMMENDATION:   1) This was a daytime sleep study to accommodate the patient's usual sleep habit. Lights out at 8:48 AM. Significant difficulty maintaining sleep. The patient was awake most of the time between 10:30 AM and 2 PM with some fragmented sleep between noon and 1:30 AM. No bedtime medications reported. Discussion of sleep habits and schedule as well as potential role for a sleep medication might be considered. 2) Mild obstructive sleep apnea/hypopnea syndrome, AHI 7.7 per hour with events more common while supine. REM AHI 40 per hour. Very loud snoring with oxygen desaturation to a nadir of 86% and mean  saturation 93.6% on room air. 3) There were not enough early respiratory events to meet protocol requirements for trial of split CPAP titration. Most events developed after noon.  Deneise Lever Diplomate, American Board of Sleep Medicine  ELECTRONICALLY SIGNED ON:  04/23/2014, 9:51 AM [place] PH: [phone]   FX: [phone] [place]

## 2014-11-24 ENCOUNTER — Other Ambulatory Visit (HOSPITAL_BASED_OUTPATIENT_CLINIC_OR_DEPARTMENT_OTHER): Payer: 59

## 2014-11-24 ENCOUNTER — Encounter: Payer: Self-pay | Admitting: Hematology and Oncology

## 2014-11-24 ENCOUNTER — Telehealth: Payer: Self-pay | Admitting: Hematology and Oncology

## 2014-11-24 ENCOUNTER — Ambulatory Visit (HOSPITAL_BASED_OUTPATIENT_CLINIC_OR_DEPARTMENT_OTHER): Payer: 59 | Admitting: Hematology and Oncology

## 2014-11-24 VITALS — BP 148/73 | HR 75 | Temp 98.1°F | Resp 18 | Ht 66.0 in | Wt 216.5 lb

## 2014-11-24 DIAGNOSIS — Z23 Encounter for immunization: Secondary | ICD-10-CM | POA: Diagnosis not present

## 2014-11-24 DIAGNOSIS — C9110 Chronic lymphocytic leukemia of B-cell type not having achieved remission: Secondary | ICD-10-CM | POA: Diagnosis not present

## 2014-11-24 DIAGNOSIS — L989 Disorder of the skin and subcutaneous tissue, unspecified: Secondary | ICD-10-CM

## 2014-11-24 DIAGNOSIS — Z418 Encounter for other procedures for purposes other than remedying health state: Secondary | ICD-10-CM | POA: Diagnosis not present

## 2014-11-24 DIAGNOSIS — Z299 Encounter for prophylactic measures, unspecified: Secondary | ICD-10-CM

## 2014-11-24 LAB — CBC WITH DIFFERENTIAL/PLATELET
BASO%: 0.2 % (ref 0.0–2.0)
Basophils Absolute: 0 10*3/uL (ref 0.0–0.1)
EOS ABS: 0.1 10*3/uL (ref 0.0–0.5)
EOS%: 0.6 % (ref 0.0–7.0)
HCT: 42.5 % (ref 38.4–49.9)
HEMOGLOBIN: 14 g/dL (ref 13.0–17.1)
LYMPH%: 77.5 % — ABNORMAL HIGH (ref 14.0–49.0)
MCH: 29.9 pg (ref 27.2–33.4)
MCHC: 33 g/dL (ref 32.0–36.0)
MCV: 90.4 fL (ref 79.3–98.0)
MONO#: 0.6 10*3/uL (ref 0.1–0.9)
MONO%: 3.5 % (ref 0.0–14.0)
NEUT%: 18.2 % — ABNORMAL LOW (ref 39.0–75.0)
NEUTROS ABS: 3.2 10*3/uL (ref 1.5–6.5)
Platelets: 254 10*3/uL (ref 140–400)
RBC: 4.7 10*6/uL (ref 4.20–5.82)
RDW: 13.3 % (ref 11.0–14.6)
WBC: 17.4 10*3/uL — AB (ref 4.0–10.3)
lymph#: 13.4 10*3/uL — ABNORMAL HIGH (ref 0.9–3.3)

## 2014-11-24 LAB — COMPREHENSIVE METABOLIC PANEL (CC13)
ALBUMIN: 4.1 g/dL (ref 3.5–5.0)
ALK PHOS: 65 U/L (ref 40–150)
ALT: 22 U/L (ref 0–55)
AST: 20 U/L (ref 5–34)
Anion Gap: 6 mEq/L (ref 3–11)
BILIRUBIN TOTAL: 0.43 mg/dL (ref 0.20–1.20)
BUN: 15.4 mg/dL (ref 7.0–26.0)
CO2: 28 meq/L (ref 22–29)
Calcium: 9.8 mg/dL (ref 8.4–10.4)
Chloride: 106 mEq/L (ref 98–109)
Creatinine: 1.1 mg/dL (ref 0.7–1.3)
EGFR: 75 mL/min/{1.73_m2} — ABNORMAL LOW (ref >90)
GLUCOSE: 112 mg/dL (ref 70–140)
Potassium: 4.8 mEq/L (ref 3.5–5.1)
SODIUM: 140 meq/L (ref 136–145)
TOTAL PROTEIN: 7.2 g/dL (ref 6.4–8.3)

## 2014-11-24 LAB — LACTATE DEHYDROGENASE (CC13): LDH: 161 U/L (ref 125–245)

## 2014-11-24 MED ORDER — INFLUENZA VAC SPLIT QUAD 0.5 ML IM SUSY
0.5000 mL | PREFILLED_SYRINGE | Freq: Once | INTRAMUSCULAR | Status: AC
  Administered 2014-11-24: 0.5 mL via INTRAMUSCULAR
  Filled 2014-11-24: qty 0.5

## 2014-11-24 NOTE — Assessment & Plan Note (Signed)
Clinically, he has no signs of disease progression. He will remain at stage 0. ?I will see him next year in 12 months with history, physical examination and blood work. ?

## 2014-11-24 NOTE — Telephone Encounter (Signed)
per pof to sch pt appt-gave pt copy of avs °

## 2014-11-24 NOTE — Assessment & Plan Note (Signed)
We discussed the importance of preventive care and reviewed the vaccination programs. He does not have any prior allergic reactions to influenza vaccination. He agrees to proceed with influenza vaccination today and we will administer it today at the clinic.  

## 2014-11-24 NOTE — Progress Notes (Signed)
[facility] OFFICE PROGRESS NOTE  Patient Care Team: Jilda Panda, MD as PCP - General (Internal Medicine)  SUMMARY OF ONCOLOGIC HISTORY:   CLL (chronic lymphocytic leukemia)   08/30/2013 Initial Diagnosis CLL (chronic lymphocytic leukemia)   02/15/2014 Pathology Results FISH for CLL was negative    INTERVAL HISTORY: Please see below for problem oriented charting. he returns for further follow-up. He continues to have diffuse skin lesions that bother him. No new lymphadenopathy. He had one bout of infection over the winter but nothing over the last 6 months.  REVIEW OF SYSTEMS:   Constitutional: Denies fevers, chills or abnormal weight loss Eyes: Denies blurriness of vision Ears, nose, mouth, throat, and face: Denies mucositis or sore throat Respiratory: Denies cough, dyspnea or wheezes Cardiovascular: Denies palpitation, chest discomfort or lower extremity swelling Gastrointestinal:  Denies nausea, heartburn or change in bowel habits Lymphatics: Denies new lymphadenopathy or easy bruising Neurological:Denies numbness, tingling or new weaknesses Behavioral/Psych: Mood is stable, no new changes  All other systems were reviewed with the patient and are negative.  I have reviewed the past medical history, past surgical history, social history and family history with the patient and they are unchanged from previous note.  ALLERGIES:  has No Known Allergies.  MEDICATIONS:  Current Outpatient Prescriptions  Medication Sig Dispense Refill  . aspirin 81 MG chewable tablet Chew by mouth daily.    . benazepril (LOTENSIN) 40 MG tablet Take 40 mg by mouth daily.    . CRESTOR 5 MG tablet Take 5 mg by mouth daily.     No current facility-administered medications for this visit.    PHYSICAL EXAMINATION: ECOG PERFORMANCE STATUS: 0 - Asymptomatic  Filed Vitals:   11/24/14 1443  BP: 148/73  Pulse: 75  Temp: 98.1 F (36.7 C)  Resp: 18   Filed Weights   11/24/14 1443   Weight: 216 lb 8 oz (98.204 kg)    GENERAL:alert, no distress and comfortable SKIN: He has diffuse solar keratosis at the sun exposed region EYES: normal, Conjunctiva are pink and non-injected, sclera clear OROPHARYNX:no exudate, no erythema and lips, buccal mucosa, and tongue normal  NECK: supple, thyroid normal size, non-tender, without nodularity LYMPH:  no palpable lymphadenopathy in the cervical, axillary or inguinal LUNGS: clear to auscultation and percussion with normal breathing effort HEART: regular rate & rhythm and no murmurs and no lower extremity edema ABDOMEN:abdomen soft, non-tender and normal bowel sounds Musculoskeletal:no cyanosis of digits and no clubbing  NEURO: alert & oriented x 3 with fluent speech, no focal motor/sensory deficits  LABORATORY DATA:  I have reviewed the data as listed    Component Value Date/Time   NA 139 02/14/2014 1438   K 4.2 02/14/2014 1438   CO2 26 02/14/2014 1438   GLUCOSE 112 02/14/2014 1438   BUN 10.7 02/14/2014 1438   CREATININE 1.0 02/14/2014 1438   CALCIUM 9.6 02/14/2014 1438   PROT 7.3 02/14/2014 1438   ALBUMIN 4.1 02/14/2014 1438   AST 27 02/14/2014 1438   ALT 34 02/14/2014 1438   ALKPHOS 64 02/14/2014 1438   BILITOT 0.58 02/14/2014 1438    No results found for: SPEP, UPEP  Lab Results  Component Value Date   WBC 17.4* 11/24/2014   NEUTROABS 3.2 11/24/2014   HGB 14.0 11/24/2014   HCT 42.5 11/24/2014   MCV 90.4 11/24/2014   PLT 254 11/24/2014      Chemistry      Component Value Date/Time   NA 139 02/14/2014 1438  K 4.2 02/14/2014 1438   CO2 26 02/14/2014 1438   BUN 10.7 02/14/2014 1438   CREATININE 1.0 02/14/2014 1438      Component Value Date/Time   CALCIUM 9.6 02/14/2014 1438   ALKPHOS 64 02/14/2014 1438   AST 27 02/14/2014 1438   ALT 34 02/14/2014 1438   BILITOT 0.58 02/14/2014 1438       ASSESSMENT & PLAN:  CLL (chronic lymphocytic leukemia) Clinically, he has no signs of disease progression.  He will remain at stage 0. I will see him next year in 12 months with history, physical examination and blood work.    Preventive measure We discussed the importance of preventive care and reviewed the vaccination programs. He does not have any prior allergic reactions to influenza vaccination. He agrees to proceed with influenza vaccination today and we will administer it today at the clinic.   Skin lesions, generalized Likely solar keratosis. I recommend avoidance of excessive sun exposure and to wears sunscreen regularly.     Orders Placed This Encounter  Procedures  . CBC with Differential/Platelet    Standing Status: Future     Number of Occurrences:      Standing Expiration Date: 12/29/2015  . Comprehensive metabolic panel    Standing Status: Future     Number of Occurrences:      Standing Expiration Date: 12/29/2015  . Lactate dehydrogenase    Standing Status: Future     Number of Occurrences:      Standing Expiration Date: 12/29/2015   All questions were answered. The patient knows to call the clinic with any problems, questions or concerns. No barriers to learning was detected. I spent 15 minutes counseling the patient face to face. The total time spent in the appointment was 20 minutes and more than 50% was on counseling and review of test results     [facility], [place], MD 11/24/2014 3:13 PM

## 2014-11-24 NOTE — Assessment & Plan Note (Signed)
Likely solar keratosis. ?I recommend avoidance of excessive sun exposure and to wears sunscreen regularly. ?

## 2015-11-23 ENCOUNTER — Other Ambulatory Visit (HOSPITAL_BASED_OUTPATIENT_CLINIC_OR_DEPARTMENT_OTHER): Payer: No Typology Code available for payment source

## 2015-11-23 ENCOUNTER — Telehealth: Payer: Self-pay | Admitting: Hematology and Oncology

## 2015-11-23 ENCOUNTER — Encounter: Payer: Self-pay | Admitting: Hematology and Oncology

## 2015-11-23 ENCOUNTER — Ambulatory Visit (HOSPITAL_BASED_OUTPATIENT_CLINIC_OR_DEPARTMENT_OTHER): Payer: No Typology Code available for payment source | Admitting: Hematology and Oncology

## 2015-11-23 DIAGNOSIS — C9110 Chronic lymphocytic leukemia of B-cell type not having achieved remission: Secondary | ICD-10-CM | POA: Diagnosis not present

## 2015-11-23 DIAGNOSIS — Z23 Encounter for immunization: Secondary | ICD-10-CM

## 2015-11-23 DIAGNOSIS — Z299 Encounter for prophylactic measures, unspecified: Secondary | ICD-10-CM

## 2015-11-23 DIAGNOSIS — L989 Disorder of the skin and subcutaneous tissue, unspecified: Secondary | ICD-10-CM

## 2015-11-23 LAB — COMPREHENSIVE METABOLIC PANEL
ALT: 19 U/L (ref 0–55)
ANION GAP: 10 meq/L (ref 3–11)
AST: 19 U/L (ref 5–34)
Albumin: 4 g/dL (ref 3.5–5.0)
Alkaline Phosphatase: 67 U/L (ref 40–150)
BUN: 14.8 mg/dL (ref 7.0–26.0)
CO2: 26 meq/L (ref 22–29)
Calcium: 10 mg/dL (ref 8.4–10.4)
Chloride: 104 mEq/L (ref 98–109)
Creatinine: 0.9 mg/dL (ref 0.7–1.3)
GLUCOSE: 108 mg/dL (ref 70–140)
POTASSIUM: 4.6 meq/L (ref 3.5–5.1)
SODIUM: 140 meq/L (ref 136–145)
Total Bilirubin: 0.6 mg/dL (ref 0.20–1.20)
Total Protein: 7.7 g/dL (ref 6.4–8.3)

## 2015-11-23 LAB — CBC WITH DIFFERENTIAL/PLATELET
BASO%: 0.2 % (ref 0.0–2.0)
BASOS ABS: 0 10*3/uL (ref 0.0–0.1)
EOS ABS: 0.1 10*3/uL (ref 0.0–0.5)
EOS%: 0.5 % (ref 0.0–7.0)
HCT: 43.7 % (ref 38.4–49.9)
HGB: 14.7 g/dL (ref 13.0–17.1)
LYMPH%: 78.7 % — AB (ref 14.0–49.0)
MCH: 30.1 pg (ref 27.2–33.4)
MCHC: 33.6 g/dL (ref 32.0–36.0)
MCV: 89.4 fL (ref 79.3–98.0)
MONO#: 0.6 10*3/uL (ref 0.1–0.9)
MONO%: 3.5 % (ref 0.0–14.0)
NEUT#: 3.1 10*3/uL (ref 1.5–6.5)
NEUT%: 17.1 % — AB (ref 39.0–75.0)
PLATELETS: 243 10*3/uL (ref 140–400)
RBC: 4.89 10*6/uL (ref 4.20–5.82)
RDW: 14.2 % (ref 11.0–14.6)
WBC: 18.2 10*3/uL — AB (ref 4.0–10.3)
lymph#: 14.3 10*3/uL — ABNORMAL HIGH (ref 0.9–3.3)

## 2015-11-23 LAB — LACTATE DEHYDROGENASE: LDH: 144 U/L (ref 125–245)

## 2015-11-23 LAB — TECHNOLOGIST REVIEW

## 2015-11-23 MED ORDER — INFLUENZA VAC SPLIT QUAD 0.5 ML IM SUSY
0.5000 mL | PREFILLED_SYRINGE | Freq: Once | INTRAMUSCULAR | Status: AC
  Administered 2015-11-23: 0.5 mL via INTRAMUSCULAR
  Filled 2015-11-23: qty 0.5

## 2015-11-23 NOTE — Progress Notes (Signed)
[name] OFFICE PROGRESS NOTE  Patient Care Team: Jilda Panda, MD as PCP - General (Internal Medicine)  SUMMARY OF ONCOLOGIC HISTORY:   CLL (chronic lymphocytic leukemia) ([place])   08/30/2013 Initial Diagnosis    CLL (chronic lymphocytic leukemia)      02/15/2014 Pathology Results    FISH for CLL was negative       INTERVAL HISTORY: Please see below for problem oriented charting. He returns for follow-up. He denies recent infection. He is exercising regularly and modified his diet. He has lost over 20 pounds since I last saw him. No new lymphadenopathy.  REVIEW OF SYSTEMS:   Constitutional: Denies fevers, chills  Eyes: Denies blurriness of vision Ears, nose, mouth, throat, and face: Denies mucositis or sore throat Respiratory: Denies cough, dyspnea or wheezes Cardiovascular: Denies palpitation, chest discomfort or lower extremity swelling Gastrointestinal:  Denies nausea, heartburn or change in bowel habits Skin: Denies abnormal skin rashes Lymphatics: Denies new lymphadenopathy or easy bruising Neurological:Denies numbness, tingling or new weaknesses Behavioral/Psych: Mood is stable, no new changes  All other systems were reviewed with the patient and are negative.  I have reviewed the past medical history, past surgical history, social history and family history with the patient and they are unchanged from previous note.  ALLERGIES:  has No Known Allergies.  MEDICATIONS:  Current Outpatient Prescriptions  Medication Sig Dispense Refill  . aspirin 81 MG chewable tablet Chew by mouth daily.    . benazepril (LOTENSIN) 40 MG tablet Take 40 mg by mouth daily.    . cholecalciferol (VITAMIN D) 1000 units tablet Take 2,000 Units by mouth daily.    . Multiple Vitamin (MULTIVITAMIN) capsule Take 1 capsule by mouth daily.     No current facility-administered medications for this visit.     PHYSICAL EXAMINATION: ECOG PERFORMANCE STATUS: 0 -  Asymptomatic  Vitals:   11/23/15 1447  BP: (!) 155/82  Pulse: 71  Resp: 18  Temp: 98.5 F (36.9 C)   Filed Weights   11/23/15 1447  Weight: 194 lb 9.6 oz (88.3 kg)    GENERAL:alert, no distress and comfortable SKIN: skin color, texture, turgor are normal, no rashes or significant lesions EYES: normal, Conjunctiva are pink and non-injected, sclera clear OROPHARYNX:no exudate, no erythema and lips, buccal mucosa, and tongue normal  NECK: supple, thyroid normal size, non-tender, without nodularity LYMPH:  no palpable lymphadenopathy in the cervical, axillary or inguinal LUNGS: clear to auscultation and percussion with normal breathing effort HEART: regular rate & rhythm and no murmurs and no lower extremity edema ABDOMEN:abdomen soft, non-tender and normal bowel sounds Musculoskeletal:no cyanosis of digits and no clubbing  NEURO: alert & oriented x 3 with fluent speech, no focal motor/sensory deficits  LABORATORY DATA:  I have reviewed the data as listed    Component Value Date/Time   NA 140 11/24/2014 1428   K 4.8 11/24/2014 1428   CO2 28 11/24/2014 1428   GLUCOSE 112 11/24/2014 1428   BUN 15.4 11/24/2014 1428   CREATININE 1.1 11/24/2014 1428   CALCIUM 9.8 11/24/2014 1428   PROT 7.2 11/24/2014 1428   ALBUMIN 4.1 11/24/2014 1428   AST 20 11/24/2014 1428   ALT 22 11/24/2014 1428   ALKPHOS 65 11/24/2014 1428   BILITOT 0.43 11/24/2014 1428    No results found for: SPEP, UPEP  Lab Results  Component Value Date   WBC 18.2 (H) 11/23/2015   NEUTROABS 3.1 11/23/2015   HGB 14.7 11/23/2015   HCT 43.7 11/23/2015  MCV 89.4 11/23/2015   PLT 243 11/23/2015      Chemistry      Component Value Date/Time   NA 140 11/24/2014 1428   K 4.8 11/24/2014 1428   CO2 28 11/24/2014 1428   BUN 15.4 11/24/2014 1428   CREATININE 1.1 11/24/2014 1428      Component Value Date/Time   CALCIUM 9.8 11/24/2014 1428   ALKPHOS 65 11/24/2014 1428   AST 20 11/24/2014 1428   ALT 22  11/24/2014 1428   BILITOT 0.43 11/24/2014 1428     ASSESSMENT & PLAN:  CLL (chronic lymphocytic leukemia) Clinically, he has no signs of disease progression. He will remain at stage 0. I will see him next year in 12 months with history, physical examination and blood work.  Skin lesions, generalized Likely solar keratosis. I recommend avoidance of excessive sun exposure and to wears sunscreen regularly.  Preventive measure We discussed the importance of preventive care and reviewed the vaccination programs. He does not have any prior allergic reactions to influenza vaccination. He agrees to proceed with influenza vaccination today and we will administer it today at the clinic.    Orders Placed This Encounter  Procedures  . Comprehensive metabolic panel    Standing Status:   Future    Standing Expiration Date:   12/27/2016  . CBC with Differential/Platelet    Standing Status:   Future    Standing Expiration Date:   12/27/2016  . Lactate dehydrogenase    Standing Status:   Future    Standing Expiration Date:   11/22/2016   All questions were answered. The patient knows to call the clinic with any problems, questions or concerns. No barriers to learning was detected. I spent 15 minutes counseling the patient face to face. The total time spent in the appointment was 20 minutes and more than 50% was on counseling and review of test results     [facility], [place], MD 11/23/2015 3:06 PM

## 2015-11-23 NOTE — Telephone Encounter (Signed)
GAVE PATIENT AVS REPORT AND APPOINTMENTS FOR September 2018

## 2015-11-23 NOTE — Assessment & Plan Note (Signed)
Likely solar keratosis. ?I recommend avoidance of excessive sun exposure and to wears sunscreen regularly. ?

## 2015-11-23 NOTE — Assessment & Plan Note (Signed)
We discussed the importance of preventive care and reviewed the vaccination programs. He does not have any prior allergic reactions to influenza vaccination. He agrees to proceed with influenza vaccination today and we will administer it today at the clinic.  

## 2015-11-23 NOTE — Assessment & Plan Note (Signed)
Clinically, he has no signs of disease progression. He will remain at stage 0. ?I will see him next year in 12 months with history, physical examination and blood work. ?

## 2016-11-21 ENCOUNTER — Ambulatory Visit (HOSPITAL_BASED_OUTPATIENT_CLINIC_OR_DEPARTMENT_OTHER): Payer: Self-pay | Admitting: Hematology and Oncology

## 2016-11-21 ENCOUNTER — Telehealth: Payer: Self-pay | Admitting: Hematology and Oncology

## 2016-11-21 ENCOUNTER — Other Ambulatory Visit (HOSPITAL_BASED_OUTPATIENT_CLINIC_OR_DEPARTMENT_OTHER): Payer: No Typology Code available for payment source

## 2016-11-21 ENCOUNTER — Encounter: Payer: Self-pay | Admitting: Hematology and Oncology

## 2016-11-21 DIAGNOSIS — L989 Disorder of the skin and subcutaneous tissue, unspecified: Secondary | ICD-10-CM

## 2016-11-21 DIAGNOSIS — C9110 Chronic lymphocytic leukemia of B-cell type not having achieved remission: Secondary | ICD-10-CM

## 2016-11-21 DIAGNOSIS — Z299 Encounter for prophylactic measures, unspecified: Secondary | ICD-10-CM

## 2016-11-21 LAB — CBC WITH DIFFERENTIAL/PLATELET
BASO%: 0.2 % (ref 0.0–2.0)
Basophils Absolute: 0 10*3/uL (ref 0.0–0.1)
EOS ABS: 0.1 10*3/uL (ref 0.0–0.5)
EOS%: 0.3 % (ref 0.0–7.0)
HCT: 44.9 % (ref 38.4–49.9)
HEMOGLOBIN: 15.1 g/dL (ref 13.0–17.1)
LYMPH%: 82.1 % — ABNORMAL HIGH (ref 14.0–49.0)
MCH: 30.9 pg (ref 27.2–33.4)
MCHC: 33.7 g/dL (ref 32.0–36.0)
MCV: 91.7 fL (ref 79.3–98.0)
MONO#: 0.7 10*3/uL (ref 0.1–0.9)
MONO%: 2.7 % (ref 0.0–14.0)
NEUT%: 14.7 % — ABNORMAL LOW (ref 39.0–75.0)
NEUTROS ABS: 3.8 10*3/uL (ref 1.5–6.5)
PLATELETS: 276 10*3/uL (ref 140–400)
RBC: 4.9 10*6/uL (ref 4.20–5.82)
RDW: 13.4 % (ref 11.0–14.6)
WBC: 25.5 10*3/uL — AB (ref 4.0–10.3)
lymph#: 21 10*3/uL — ABNORMAL HIGH (ref 0.9–3.3)

## 2016-11-21 LAB — COMPREHENSIVE METABOLIC PANEL
ALT: 16 U/L (ref 0–55)
ANION GAP: 10 meq/L (ref 3–11)
AST: 20 U/L (ref 5–34)
Albumin: 4.4 g/dL (ref 3.5–5.0)
Alkaline Phosphatase: 58 U/L (ref 40–150)
BUN: 18 mg/dL (ref 7.0–26.0)
CALCIUM: 10.5 mg/dL — AB (ref 8.4–10.4)
CHLORIDE: 101 meq/L (ref 98–109)
CO2: 27 mEq/L (ref 22–29)
Creatinine: 1.1 mg/dL (ref 0.7–1.3)
EGFR: 75 mL/min/{1.73_m2} — AB (ref >90)
Glucose: 107 mg/dl (ref 70–140)
POTASSIUM: 4.3 meq/L (ref 3.5–5.1)
Sodium: 138 mEq/L (ref 136–145)
Total Bilirubin: 0.48 mg/dL (ref 0.20–1.20)
Total Protein: 8.1 g/dL (ref 6.4–8.3)

## 2016-11-21 LAB — LACTATE DEHYDROGENASE: LDH: 153 U/L (ref 125–245)

## 2016-11-21 LAB — TECHNOLOGIST REVIEW

## 2016-11-21 NOTE — Assessment & Plan Note (Signed)
We discussed the importance of preventive care and reviewed the vaccination programs.

## 2016-11-21 NOTE — Assessment & Plan Note (Signed)
Clinically, he has no signs of disease progression. He will remain at stage 0. ?I will see him next year in 12 months with history, physical examination and blood work. ?

## 2016-11-21 NOTE — Telephone Encounter (Signed)
Scheduled appt per 9/13 los - Gave patient AVS and calender per los.  

## 2016-11-21 NOTE — Addendum Note (Signed)
Addended by: Flo Shanks on: 11/21/2016 03:21 PM   Modules accepted: Orders

## 2016-11-21 NOTE — Assessment & Plan Note (Signed)
Likely solar keratosis. ?I recommend avoidance of excessive sun exposure and to wears sunscreen regularly. ?

## 2016-11-21 NOTE — Progress Notes (Signed)
[facility] OFFICE PROGRESS NOTE  Patient Care Team: Jilda Panda, MD as PCP - General (Internal Medicine)  SUMMARY OF ONCOLOGIC HISTORY:   CLL (chronic lymphocytic leukemia) ([place])   08/30/2013 Initial Diagnosis    CLL (chronic lymphocytic leukemia)      02/15/2014 Pathology Results    FISH for CLL was negative       INTERVAL HISTORY: Please see below for problem oriented charting. He returns for further follow-up He feels well No new lymphadenopathy His appetite is stable, no recent weight loss Denies recurrent infection.  REVIEW OF SYSTEMS:   Constitutional: Denies fevers, chills or abnormal weight loss Eyes: Denies blurriness of vision Ears, nose, mouth, throat, and face: Denies mucositis or sore throat Respiratory: Denies cough, dyspnea or wheezes Cardiovascular: Denies palpitation, chest discomfort or lower extremity swelling Gastrointestinal:  Denies nausea, heartburn or change in bowel habits Skin: Denies abnormal skin rashes Lymphatics: Denies new lymphadenopathy or easy bruising Neurological:Denies numbness, tingling or new weaknesses Behavioral/Psych: Mood is stable, no new changes  All other systems were reviewed with the patient and are negative.  I have reviewed the past medical history, past surgical history, social history and family history with the patient and they are unchanged from previous note.  ALLERGIES:  has No Known Allergies.  MEDICATIONS:  Current Outpatient Prescriptions  Medication Sig Dispense Refill  . aspirin 81 MG chewable tablet Chew by mouth daily.    . benazepril (LOTENSIN) 40 MG tablet Take 40 mg by mouth daily.    . cholecalciferol (VITAMIN D) 1000 units tablet Take 2,000 Units by mouth daily.    . Multiple Vitamin (MULTIVITAMIN) capsule Take 1 capsule by mouth daily.     No current facility-administered medications for this visit.     PHYSICAL EXAMINATION: ECOG PERFORMANCE STATUS: 0 - Asymptomatic  Vitals:   11/21/16 1441  BP: (!) 142/63  Pulse: 70  Resp: 20  SpO2: 97%   Filed Weights   11/21/16 1441  Weight: 193 lb 3.2 oz (87.6 kg)    GENERAL:alert, no distress and comfortable.  He is moderately obese SKIN: skin color, texture, turgor are normal, no rashes or significant lesions.  Noted diffuse solar keratosis EYES: normal, Conjunctiva are pink and non-injected, sclera clear OROPHARYNX:no exudate, no erythema and lips, buccal mucosa, and tongue normal  NECK: supple, thyroid normal size, non-tender, without nodularity LYMPH:  no palpable lymphadenopathy in the cervical, axillary or inguinal LUNGS: clear to auscultation and percussion with normal breathing effort HEART: regular rate & rhythm and no murmurs and no lower extremity edema ABDOMEN:abdomen soft, non-tender and normal bowel sounds Musculoskeletal:no cyanosis of digits and no clubbing  NEURO: alert & oriented x 3 with fluent speech, no focal motor/sensory deficits  LABORATORY DATA:  I have reviewed the data as listed    Component Value Date/Time   NA 140 11/23/2015 1427   K 4.6 11/23/2015 1427   CO2 26 11/23/2015 1427   GLUCOSE 108 11/23/2015 1427   BUN 14.8 11/23/2015 1427   CREATININE 0.9 11/23/2015 1427   CALCIUM 10.0 11/23/2015 1427   PROT 7.7 11/23/2015 1427   ALBUMIN 4.0 11/23/2015 1427   AST 19 11/23/2015 1427   ALT 19 11/23/2015 1427   ALKPHOS 67 11/23/2015 1427   BILITOT 0.60 11/23/2015 1427    No results found for: SPEP, UPEP  Lab Results  Component Value Date   WBC 25.5 (H) 11/21/2016   NEUTROABS 3.8 11/21/2016   HGB 15.1 11/21/2016   HCT 44.9 11/21/2016  MCV 91.7 11/21/2016   PLT 276 11/21/2016      Chemistry      Component Value Date/Time   NA 140 11/23/2015 1427   K 4.6 11/23/2015 1427   CO2 26 11/23/2015 1427   BUN 14.8 11/23/2015 1427   CREATININE 0.9 11/23/2015 1427      Component Value Date/Time   CALCIUM 10.0 11/23/2015 1427   ALKPHOS 67 11/23/2015 1427   AST 19 11/23/2015 1427    ALT 19 11/23/2015 1427   BILITOT 0.60 11/23/2015 1427     ASSESSMENT & PLAN:  CLL (chronic lymphocytic leukemia) Clinically, he has no signs of disease progression. He will remain at stage 0. I will see him next year in 12 months with history, physical examination and blood work.  Preventive measure We discussed the importance of preventive care and reviewed the vaccination programs.   Skin lesions, generalized Likely solar keratosis. I recommend avoidance of excessive sun exposure and to wears sunscreen regularly.   No orders of the defined types were placed in this encounter.  All questions were answered. The patient knows to call the clinic with any problems, questions or concerns. No barriers to learning was detected. I spent 10 minutes counseling the patient face to face. The total time spent in the appointment was 15 minutes and more than 50% was on counseling and review of test results     Heath Lark, MD 11/21/2016 2:59 PM

## 2017-04-16 ENCOUNTER — Encounter: Payer: Self-pay | Admitting: Gastroenterology

## 2017-11-20 ENCOUNTER — Inpatient Hospital Stay: Payer: Self-pay

## 2017-11-20 ENCOUNTER — Encounter: Payer: Self-pay | Admitting: Hematology and Oncology

## 2017-11-20 ENCOUNTER — Other Ambulatory Visit: Payer: Self-pay | Admitting: Hematology and Oncology

## 2017-11-20 ENCOUNTER — Inpatient Hospital Stay: Payer: Self-pay | Attending: Hematology and Oncology | Admitting: Hematology and Oncology

## 2017-11-20 ENCOUNTER — Telehealth: Payer: Self-pay | Admitting: Hematology and Oncology

## 2017-11-20 VITALS — BP 132/68 | HR 64 | Temp 98.4°F | Resp 18 | Ht 66.0 in | Wt 200.6 lb

## 2017-11-20 DIAGNOSIS — C9110 Chronic lymphocytic leukemia of B-cell type not having achieved remission: Secondary | ICD-10-CM

## 2017-11-20 DIAGNOSIS — Z23 Encounter for immunization: Secondary | ICD-10-CM | POA: Insufficient documentation

## 2017-11-20 DIAGNOSIS — Z7982 Long term (current) use of aspirin: Secondary | ICD-10-CM | POA: Insufficient documentation

## 2017-11-20 DIAGNOSIS — E669 Obesity, unspecified: Secondary | ICD-10-CM | POA: Insufficient documentation

## 2017-11-20 DIAGNOSIS — Z299 Encounter for prophylactic measures, unspecified: Secondary | ICD-10-CM

## 2017-11-20 DIAGNOSIS — Z79899 Other long term (current) drug therapy: Secondary | ICD-10-CM | POA: Insufficient documentation

## 2017-11-20 DIAGNOSIS — L989 Disorder of the skin and subcutaneous tissue, unspecified: Secondary | ICD-10-CM | POA: Insufficient documentation

## 2017-11-20 DIAGNOSIS — C9190 Lymphoid leukemia, unspecified not having achieved remission: Secondary | ICD-10-CM | POA: Insufficient documentation

## 2017-11-20 LAB — CBC WITH DIFFERENTIAL/PLATELET
BASOS ABS: 0.1 10*3/uL (ref 0.0–0.1)
Basophils Relative: 0 %
EOS ABS: 0.1 10*3/uL (ref 0.0–0.5)
Eosinophils Relative: 1 %
HCT: 42.3 % (ref 38.4–49.9)
HEMOGLOBIN: 13.9 g/dL (ref 13.0–17.1)
LYMPHS PCT: 83 %
Lymphs Abs: 18.7 10*3/uL — ABNORMAL HIGH (ref 0.9–3.3)
MCH: 30.1 pg (ref 27.2–33.4)
MCHC: 33 g/dL (ref 32.0–36.0)
MCV: 91.3 fL (ref 79.3–98.0)
Monocytes Absolute: 0.8 10*3/uL (ref 0.1–0.9)
Monocytes Relative: 3 %
NEUTROS PCT: 13 %
Neutro Abs: 2.9 10*3/uL (ref 1.5–6.5)
PLATELETS: 240 10*3/uL (ref 140–400)
RBC: 4.63 MIL/uL (ref 4.20–5.82)
RDW: 13.6 % (ref 11.0–14.6)
WBC: 22.5 10*3/uL — ABNORMAL HIGH (ref 4.0–10.3)

## 2017-11-20 MED ORDER — INFLUENZA VAC SPLIT QUAD 0.5 ML IM SUSY
0.5000 mL | PREFILLED_SYRINGE | Freq: Once | INTRAMUSCULAR | Status: AC
  Administered 2017-11-20: 0.5 mL via INTRAMUSCULAR

## 2017-11-20 NOTE — Assessment & Plan Note (Signed)
The patient has gained a lot of weight He is interested to lose some weight We discussed dietary modification and lifestyle intervention including reduce carbohydrate intake and increase activity as tolerated.

## 2017-11-20 NOTE — Patient Instructions (Signed)
Preventing Influenza, Adult Influenza, more commonly known as "the flu," is a viral infection that mainly affects the respiratory tract. The respiratory tract includes structures that help you breathe, such as the lungs, nose, and throat. The flu causes many common cold symptoms, as well as a high fever and body aches. The flu spreads easily from person to person (is contagious). The flu is most common from December through March. This is called flu season.You can catch the flu virus by:  Breathing in droplets from an infected person's cough or sneeze.  Touching something that was recently contaminated with the virus and then touching your mouth, nose, or eyes.  What can I do to lower my risk? You can decrease your risk of getting the flu by:  Getting a flu shot (influenza vaccination) every year. This is the best way to prevent the flu. A flu shot is recommended for everyone age 6 months and older. ? It is best to get a flu shot in the fall, as soon as it is available. Getting a flu shot during winter or spring instead is still a good idea. Flu season can last into early spring. ? Preventing the flu through vaccination requires getting a new flu shot every year. This is because the flu virus changes slightly (mutates) from one year to the next. Even if a flu shot does not completely protect you from all flu virus mutations, it can reduce the severity of your illness and prevent dangerous complications of the flu. ? If you are pregnant, you can and should get a flu shot. ? If you have had a reaction to the shot in the past or if you are allergic to eggs, check with your health care provider before getting a flu shot. ? Sometimes the vaccine is available as a nasal spray. In some years, the nasal spray has not been as effective against the flu virus. Check with your health care provider if you have questions about this.  Practicing good health habits. This is especially important during flu  season. ? Avoid contact with people who are sick with flu or cold symptoms. ? Wash your hands with soap and water often. If soap and water are not available, use hand sanitizer. ? Avoid touching your hands to your face, especially when you have not washed your hands recently. ? Use a disinfectant to clean surfaces at home and at work that may be contaminated with the flu virus. ? Keep your body's disease-fighting system (immune system) in good shape by eating a healthy diet, drinking plenty of fluids, getting enough sleep, and exercising regularly.  If you do get the flu, avoid spreading it to others by:  Staying home until your symptoms have been gone for at least one day.  Covering your mouth and nose with your elbow when you cough or sneeze.  Avoiding close contact with others, especially babies and elderly people.  Why are these changes important? Getting a flu shot and practicing good health habits protects you as well as other people. If you get the flu, your friends, family, and co-workers are also at risk of getting it, because it spreads so easily to others. Each year, about 2 out of every 10 people get the flu. Having the flu can lead to complications, such as pneumonia, ear infection, and sinus infection. The flu also can be deadly, especially for babies, people older than age 65, and people who have serious long-term diseases. How is this treated? Most people recover   from the flu by resting at home and drinking plenty of fluids. However, a prescription antiviral medicine may reduce your flu symptoms and may make your flu go away sooner. This medicine must be started within a few days of getting flu symptoms. You can talk with your health care provider about whether you need an antiviral medicine. Antiviral medicine may be prescribed for people who are at risk for more serious flu symptoms. This includes people who:  Are older than age 65.  Are pregnant.  Have a condition that  makes the flu worse or more dangerous.  Where to find more information:  Centers for Disease Control and Prevention: www.cdc.gov/flu/index.htm  Flu.gov: www.flu.gov/prevention-vaccination  American Academy [employer]: familydoctor.org/familydoctor/en/kids/vaccines/preventing-the-flu.html Contact a health care provider if:  You have influenza and you develop new symptoms.  You have: ? Chest pain. ? Diarrhea. ? A fever.  Your cough gets worse, or you produce more mucus. Summary  The best way to prevent the flu is to get a flu shot every year in the fall.  Even if you get the flu after you have received the yearly vaccine, your flu may be milder and go away sooner because of your flu shot.  If you get the flu, antiviral medicines that are started with a few days of symptoms may reduce your flu symptoms and may make your flu go away sooner.  You can also help prevent the flu by practicing good health habits. This information is not intended to replace advice given to you by your health care provider. Make sure you discuss any questions you have with your health care provider. Document Released: 03/12/2015 Document Revised: 11/04/2015 Document Reviewed: 11/04/2015 Elsevier Interactive Patient Education  2018 [employer]  

## 2017-11-20 NOTE — Telephone Encounter (Signed)
Gave avs and calendar ° °

## 2017-11-20 NOTE — Assessment & Plan Note (Signed)
Clinically, he has no signs of disease progression. He will remain at stage 0. ?I will see him next year in 12 months with history, physical examination and blood work. ?

## 2017-11-20 NOTE — Progress Notes (Signed)
[place] OFFICE PROGRESS NOTE  Patient Care Team: Jilda Panda, MD as PCP - General (Internal Medicine)  ASSESSMENT & PLAN:  CLL (chronic lymphocytic leukemia) Clinically, he has no signs of disease progression. He will remain at stage 0. I will see him next year in 12 months with history, physical examination and blood work.  Preventive measure We discussed the importance of preventive care and reviewed the vaccination programs. He does not have any prior allergic reactions to influenza vaccination. He agrees to proceed with influenza vaccination today and we will administer it today at the clinic.   Skin lesions, generalized Likely solar keratosis. I recommend avoidance of excessive sun exposure and to wears sunscreen regularly.  Obesity (BMI 30-39.9) The patient has gained a lot of weight He is interested to lose some weight We discussed dietary modification and lifestyle intervention including reduce carbohydrate intake and increase activity as tolerated.   No orders of the defined types were placed in this encounter.   INTERVAL HISTORY: Please see below for problem oriented charting. He returns for follow-up on CLL Denies new lymphadenopathy No recent infection, fever or chills The patient developed gout several months ago but has since no recurrence when he adjusted his diet He has gained a lot of weight and appears to be motivated for weight loss He is vigilant about skin protection and wear sunscreen when he goes out  SUMMARY OF ONCOLOGIC HISTORY:   CLL (chronic lymphocytic leukemia) ([place])   08/30/2013 Initial Diagnosis    CLL (chronic lymphocytic leukemia)    02/15/2014 Pathology Results    FISH for CLL was negative     REVIEW OF SYSTEMS:   Constitutional: Denies fevers, chills or abnormal weight loss Eyes: Denies blurriness of vision Ears, nose, mouth, throat, and face: Denies mucositis or sore throat Respiratory: Denies cough, dyspnea or  wheezes Cardiovascular: Denies palpitation, chest discomfort or lower extremity swelling Gastrointestinal:  Denies nausea, heartburn or change in bowel habits Lymphatics: Denies new lymphadenopathy or easy bruising Neurological:Denies numbness, tingling or new weaknesses Behavioral/Psych: Mood is stable, no new changes  All other systems were reviewed with the patient and are negative.  I have reviewed the past medical history, past surgical history, social history and family history with the patient and they are unchanged from previous note.  ALLERGIES:  has No Known Allergies.  MEDICATIONS:  Current Outpatient Medications  Medication Sig Dispense Refill  . allopurinol (ZYLOPRIM) 100 MG tablet Take 100 mg by mouth daily.  6  . aspirin 325 MG tablet Take 325 mg by mouth daily.    . benazepril (LOTENSIN) 40 MG tablet Take 40 mg by mouth daily.    . Cholecalciferol (VITAMIN D-1000 MAX ST) 1000 units tablet Take 1,000 Units by mouth daily.    Marland Kitchen losartan (COZAAR) 100 MG tablet Take 100 mg by mouth daily.  6  . metoprolol tartrate (LOPRESSOR) 25 MG tablet Take 25 mg by mouth 2 (two) times daily.  6  . Multiple Vitamin (MULTIVITAMIN) capsule Take 1 capsule by mouth daily.    . vitamin B-12 (CYANOCOBALAMIN) 1000 MCG tablet Take 1,000 mcg by mouth daily.    . vitamin C (ASCORBIC ACID) 250 MG tablet Take 250 mg by mouth daily. Takes 240 mg     No current facility-administered medications for this visit.     PHYSICAL EXAMINATION: ECOG PERFORMANCE STATUS: 0 - Asymptomatic  Vitals:   11/20/17 1502  BP: 132/68  Pulse: 64  Resp: 18  Temp: 98.4 F (  36.9 C)  SpO2: 99%   Filed Weights   11/20/17 1502  Weight: 200 lb 9.6 oz (91 kg)    GENERAL:alert, no distress and comfortable SKIN: skin color, texture, turgor are normal, no rashes or significant lesions.  Noted significant solar keratosis EYES: normal, Conjunctiva are pink and non-injected, sclera clear OROPHARYNX:no exudate, no  erythema and lips, buccal mucosa, and tongue normal  NECK: supple, thyroid normal size, non-tender, without nodularity LYMPH:  no palpable lymphadenopathy in the cervical, axillary or inguinal LUNGS: clear to auscultation and percussion with normal breathing effort HEART: regular rate & rhythm and no murmurs and no lower extremity edema ABDOMEN:abdomen soft, non-tender and normal bowel sounds Musculoskeletal:no cyanosis of digits and no clubbing  NEURO: alert & oriented x 3 with fluent speech, no focal motor/sensory deficits  LABORATORY DATA:  I have reviewed the data as listed    Component Value Date/Time   NA 138 11/21/2016 1408   K 4.3 11/21/2016 1408   CO2 27 11/21/2016 1408   GLUCOSE 107 11/21/2016 1408   BUN 18.0 11/21/2016 1408   CREATININE 1.1 11/21/2016 1408   CALCIUM 10.5 (H) 11/21/2016 1408   PROT 8.1 11/21/2016 1408   ALBUMIN 4.4 11/21/2016 1408   AST 20 11/21/2016 1408   ALT 16 11/21/2016 1408   ALKPHOS 58 11/21/2016 1408   BILITOT 0.48 11/21/2016 1408    No results found for: SPEP, UPEP  Lab Results  Component Value Date   WBC 22.5 (H) 11/20/2017   NEUTROABS 2.9 11/20/2017   HGB 13.9 11/20/2017   HCT 42.3 11/20/2017   MCV 91.3 11/20/2017   PLT 240 11/20/2017      Chemistry      Component Value Date/Time   NA 138 11/21/2016 1408   K 4.3 11/21/2016 1408   CO2 27 11/21/2016 1408   BUN 18.0 11/21/2016 1408   CREATININE 1.1 11/21/2016 1408      Component Value Date/Time   CALCIUM 10.5 (H) 11/21/2016 1408   ALKPHOS 58 11/21/2016 1408   AST 20 11/21/2016 1408   ALT 16 11/21/2016 1408   BILITOT 0.48 11/21/2016 1408      All questions were answered. The patient knows to call the clinic with any problems, questions or concerns. No barriers to learning was detected.  I spent 15 minutes counseling the patient face to face. The total time spent in the appointment was 20 minutes and more than 50% was on counseling and review of test results  Heath Lark,  MD 11/20/2017 3:47 PM

## 2017-11-20 NOTE — Assessment & Plan Note (Signed)
We discussed the importance of preventive care and reviewed the vaccination programs. He does not have any prior allergic reactions to influenza vaccination. He agrees to proceed with influenza vaccination today and we will administer it today at the clinic.  

## 2017-11-20 NOTE — Assessment & Plan Note (Signed)
Likely solar keratosis. ?I recommend avoidance of excessive sun exposure and to wears sunscreen regularly. ?

## 2018-11-09 ENCOUNTER — Telehealth: Payer: Self-pay

## 2018-11-09 NOTE — Telephone Encounter (Signed)
LVM regarding below msg 

## 2018-11-09 NOTE — Telephone Encounter (Signed)
-----   Message from Heath Lark, MD sent at 11/09/2018 12:48 PM EDT ----- Regarding: can he come in sooner for appt in 2 weeks?

## 2018-11-09 NOTE — Telephone Encounter (Signed)
Pt rescheduled to earlier in day.

## 2018-11-20 ENCOUNTER — Telehealth: Payer: Self-pay

## 2018-11-20 NOTE — Telephone Encounter (Signed)
Called and canceled 9/14 appts. On 5/13 PCP labs showed no change. Scheduling message sent for appts next May per Dr. Alvy Bimler. He verbalized understatnding.

## 2018-11-20 NOTE — Telephone Encounter (Signed)
Called and left a message asking him to call the office. 

## 2018-11-23 ENCOUNTER — Inpatient Hospital Stay: Payer: Self-pay | Admitting: Hematology and Oncology

## 2018-11-23 ENCOUNTER — Inpatient Hospital Stay: Payer: Self-pay

## 2018-11-24 ENCOUNTER — Telehealth: Payer: Self-pay | Admitting: Hematology and Oncology

## 2018-11-24 NOTE — Telephone Encounter (Signed)
Scheduled appt per 9/11 - unable to reach pt . Left message with appt date and time and mailed letter

## 2019-07-05 ENCOUNTER — Telehealth: Payer: Self-pay

## 2019-07-05 NOTE — Telephone Encounter (Signed)
Called and left below message. Ask him to call the office back. ?

## 2019-07-05 NOTE — Telephone Encounter (Signed)
-----   Message from Heath Lark, MD sent at 07/05/2019 10:48 AM EDT ----- Regarding: appt next month: can he come in sooner?

## 2019-07-05 NOTE — Telephone Encounter (Signed)
Called back and given earlier appt time. He is aware of time of appt.

## 2019-07-19 ENCOUNTER — Other Ambulatory Visit: Payer: Self-pay | Admitting: Hematology and Oncology

## 2019-07-19 DIAGNOSIS — C9110 Chronic lymphocytic leukemia of B-cell type not having achieved remission: Secondary | ICD-10-CM

## 2019-07-20 ENCOUNTER — Inpatient Hospital Stay (HOSPITAL_BASED_OUTPATIENT_CLINIC_OR_DEPARTMENT_OTHER): Payer: No Typology Code available for payment source | Admitting: Hematology and Oncology

## 2019-07-20 ENCOUNTER — Other Ambulatory Visit: Payer: Self-pay

## 2019-07-20 ENCOUNTER — Inpatient Hospital Stay: Payer: No Typology Code available for payment source | Attending: Hematology and Oncology

## 2019-07-20 ENCOUNTER — Ambulatory Visit: Payer: Self-pay | Admitting: Hematology and Oncology

## 2019-07-20 ENCOUNTER — Encounter: Payer: Self-pay | Admitting: Hematology and Oncology

## 2019-07-20 DIAGNOSIS — L989 Disorder of the skin and subcutaneous tissue, unspecified: Secondary | ICD-10-CM

## 2019-07-20 DIAGNOSIS — C9110 Chronic lymphocytic leukemia of B-cell type not having achieved remission: Secondary | ICD-10-CM | POA: Insufficient documentation

## 2019-07-20 DIAGNOSIS — Z79899 Other long term (current) drug therapy: Secondary | ICD-10-CM | POA: Insufficient documentation

## 2019-07-20 LAB — CBC WITH DIFFERENTIAL/PLATELET
Abs Immature Granulocytes: 0.02 10*3/uL (ref 0.00–0.07)
Basophils Absolute: 0.1 10*3/uL (ref 0.0–0.1)
Basophils Relative: 0 %
Eosinophils Absolute: 0.2 10*3/uL (ref 0.0–0.5)
Eosinophils Relative: 1 %
HCT: 41.8 % (ref 39.0–52.0)
Hemoglobin: 13.7 g/dL (ref 13.0–17.0)
Immature Granulocytes: 0 %
Lymphocytes Relative: 89 %
Lymphs Abs: 24 10*3/uL — ABNORMAL HIGH (ref 0.7–4.0)
MCH: 30.4 pg (ref 26.0–34.0)
MCHC: 32.8 g/dL (ref 30.0–36.0)
MCV: 92.9 fL (ref 80.0–100.0)
Monocytes Absolute: 0.6 10*3/uL (ref 0.1–1.0)
Monocytes Relative: 2 %
Neutro Abs: 2.1 10*3/uL (ref 1.7–7.7)
Neutrophils Relative %: 8 %
Platelets: 231 10*3/uL (ref 150–400)
RBC: 4.5 MIL/uL (ref 4.22–5.81)
RDW: 13.2 % (ref 11.5–15.5)
WBC: 27 10*3/uL — ABNORMAL HIGH (ref 4.0–10.5)
nRBC: 0 % (ref 0.0–0.2)

## 2019-07-20 NOTE — Assessment & Plan Note (Signed)
Likely solar keratosis. ?I recommend avoidance of excessive sun exposure and to wears sunscreen regularly. ?

## 2019-07-20 NOTE — Assessment & Plan Note (Signed)
Clinically, he has no signs of disease progression. He will remain at stage 0. ?I will see him next year in 12 months with history, physical examination and blood work. ?

## 2019-07-20 NOTE — Progress Notes (Signed)
[place] OFFICE PROGRESS NOTE  Patient Care Team: Jilda Panda, MD as PCP - General (Internal Medicine)  ASSESSMENT & PLAN:  CLL (chronic lymphocytic leukemia) Clinically, he has no signs of disease progression. He will remain at stage 0. I will see him next year in 12 months with history, physical examination and blood work.  Skin lesions, generalized Likely solar keratosis. I recommend avoidance of excessive sun exposure and to wears sunscreen regularly.   Orders Placed This Encounter  Procedures  . CBC with Differential/Platelet    Standing Status:   Future    Standing Expiration Date:   08/23/2020    All questions were answered. The patient knows to call the clinic with any problems, questions or concerns. The total time spent in the appointment was 15 minutes encounter with patients including review of chart and various tests results, discussions about plan of care and coordination of care plan   [name] Lark, MD 07/20/2019 4:08 PM  INTERVAL HISTORY: Please see below for problem oriented charting. He returns for CLL follow-up He is feeling well No new lymphadenopathy No recent infection, fever or chills He follows with dermatologist on a regular basis for his skin check  SUMMARY OF ONCOLOGIC HISTORY: Oncology History  CLL (chronic lymphocytic leukemia) ([place])  08/30/2013 Initial Diagnosis   CLL (chronic lymphocytic leukemia)   02/15/2014 Pathology Results   FISH for CLL was negative     REVIEW OF SYSTEMS:   Constitutional: Denies fevers, chills or abnormal weight loss Eyes: Denies blurriness of vision Ears, nose, mouth, throat, and face: Denies mucositis or sore throat Respiratory: Denies cough, dyspnea or wheezes Cardiovascular: Denies palpitation, chest discomfort or lower extremity swelling Gastrointestinal:  Denies nausea, heartburn or change in bowel habits Lymphatics: Denies new lymphadenopathy or easy bruising Neurological:Denies numbness,  tingling or new weaknesses Behavioral/Psych: Mood is stable, no new changes  All other systems were reviewed with the patient and are negative.  I have reviewed the past medical history, past surgical history, social history and family history with the patient and they are unchanged from previous note.  ALLERGIES:  has No Known Allergies.  MEDICATIONS:  Current Outpatient Medications  Medication Sig Dispense Refill  . allopurinol (ZYLOPRIM) 100 MG tablet Take 100 mg by mouth daily.  6  . benazepril (LOTENSIN) 40 MG tablet Take 40 mg by mouth daily.    . Cholecalciferol (VITAMIN D-1000 MAX ST) 1000 units tablet Take 1,000 Units by mouth daily.    Marland Kitchen losartan (COZAAR) 100 MG tablet Take 100 mg by mouth daily.  6  . metoprolol tartrate (LOPRESSOR) 25 MG tablet Take 25 mg by mouth 2 (two) times daily.  6  . Multiple Vitamin (MULTIVITAMIN) capsule Take 1 capsule by mouth daily.    . vitamin B-12 (CYANOCOBALAMIN) 1000 MCG tablet Take 1,000 mcg by mouth daily.    . vitamin C (ASCORBIC ACID) 250 MG tablet Take 250 mg by mouth daily. Takes 240 mg     No current facility-administered medications for this visit.    PHYSICAL EXAMINATION: ECOG PERFORMANCE STATUS: 0 - Asymptomatic  Vitals:   07/20/19 1040  BP: 133/66  Pulse: (!) 58  Resp: 18  Temp: 98.3 F (36.8 C)  SpO2: 99%   Filed Weights   07/20/19 1040  Weight: 197 lb 11.2 oz (89.7 kg)    GENERAL:alert, no distress and comfortable SKIN: Noted significant support keratosis EYES: normal, Conjunctiva are pink and non-injected, sclera clear OROPHARYNX:no exudate, no erythema and lips, buccal mucosa,  and tongue normal  NECK: supple, thyroid normal size, non-tender, without nodularity LYMPH:  no palpable lymphadenopathy in the cervical, axillary or inguinal LUNGS: clear to auscultation and percussion with normal breathing effort HEART: regular rate & rhythm and no murmurs and no lower extremity edema ABDOMEN:abdomen soft, non-tender  and normal bowel sounds Musculoskeletal:no cyanosis of digits and no clubbing  NEURO: alert & oriented x 3 with fluent speech, no focal motor/sensory deficits  LABORATORY DATA:  I have reviewed the data as listed    Component Value Date/Time   NA 138 11/21/2016 1408   K 4.3 11/21/2016 1408   CO2 27 11/21/2016 1408   GLUCOSE 107 11/21/2016 1408   BUN 18.0 11/21/2016 1408   CREATININE 1.1 11/21/2016 1408   CALCIUM 10.5 (H) 11/21/2016 1408   PROT 8.1 11/21/2016 1408   ALBUMIN 4.4 11/21/2016 1408   AST 20 11/21/2016 1408   ALT 16 11/21/2016 1408   ALKPHOS 58 11/21/2016 1408   BILITOT 0.48 11/21/2016 1408    No results found for: SPEP, UPEP  Lab Results  Component Value Date   WBC 27.0 (H) 07/20/2019   NEUTROABS 2.1 07/20/2019   HGB 13.7 07/20/2019   HCT 41.8 07/20/2019   MCV 92.9 07/20/2019   PLT 231 07/20/2019      Chemistry      Component Value Date/Time   NA 138 11/21/2016 1408   K 4.3 11/21/2016 1408   CO2 27 11/21/2016 1408   BUN 18.0 11/21/2016 1408   CREATININE 1.1 11/21/2016 1408      Component Value Date/Time   CALCIUM 10.5 (H) 11/21/2016 1408   ALKPHOS 58 11/21/2016 1408   AST 20 11/21/2016 1408   ALT 16 11/21/2016 1408   BILITOT 0.48 11/21/2016 1408

## 2019-07-21 ENCOUNTER — Telehealth: Payer: Self-pay | Admitting: Hematology and Oncology

## 2019-07-21 NOTE — Telephone Encounter (Signed)
Scheduled per 5/11 sch msg. Pt aware of appts.

## 2020-07-20 ENCOUNTER — Inpatient Hospital Stay
Payer: No Typology Code available for payment source | Attending: Hematology and Oncology | Admitting: Hematology and Oncology

## 2020-07-20 ENCOUNTER — Encounter: Payer: Self-pay | Admitting: Hematology and Oncology

## 2020-07-20 ENCOUNTER — Other Ambulatory Visit: Payer: Self-pay

## 2020-07-20 ENCOUNTER — Inpatient Hospital Stay: Payer: Self-pay

## 2020-07-20 ENCOUNTER — Telehealth: Payer: Self-pay | Admitting: Hematology and Oncology

## 2020-07-20 DIAGNOSIS — C9110 Chronic lymphocytic leukemia of B-cell type not having achieved remission: Secondary | ICD-10-CM | POA: Diagnosis not present

## 2020-07-20 DIAGNOSIS — E669 Obesity, unspecified: Secondary | ICD-10-CM | POA: Insufficient documentation

## 2020-07-20 DIAGNOSIS — L989 Disorder of the skin and subcutaneous tissue, unspecified: Secondary | ICD-10-CM | POA: Diagnosis not present

## 2020-07-20 DIAGNOSIS — Z79899 Other long term (current) drug therapy: Secondary | ICD-10-CM | POA: Insufficient documentation

## 2020-07-20 LAB — CBC WITH DIFFERENTIAL/PLATELET
Abs Immature Granulocytes: 0.04 10*3/uL (ref 0.00–0.07)
Basophils Absolute: 0.1 10*3/uL (ref 0.0–0.1)
Basophils Relative: 0 %
Eosinophils Absolute: 0.2 10*3/uL (ref 0.0–0.5)
Eosinophils Relative: 1 %
HCT: 42.6 % (ref 39.0–52.0)
Hemoglobin: 13.8 g/dL (ref 13.0–17.0)
Immature Granulocytes: 0 %
Lymphocytes Relative: 85 %
Lymphs Abs: 20.9 10*3/uL — ABNORMAL HIGH (ref 0.7–4.0)
MCH: 30.5 pg (ref 26.0–34.0)
MCHC: 32.4 g/dL (ref 30.0–36.0)
MCV: 94.2 fL (ref 80.0–100.0)
Monocytes Absolute: 0.7 10*3/uL (ref 0.1–1.0)
Monocytes Relative: 3 %
Neutro Abs: 2.7 10*3/uL (ref 1.7–7.7)
Neutrophils Relative %: 11 %
Platelets: 270 10*3/uL (ref 150–400)
RBC: 4.52 MIL/uL (ref 4.22–5.81)
RDW: 13.2 % (ref 11.5–15.5)
WBC: 24.6 10*3/uL — ABNORMAL HIGH (ref 4.0–10.5)
nRBC: 0 % (ref 0.0–0.2)

## 2020-07-20 NOTE — Assessment & Plan Note (Signed)
The patient has gained a lot of weight We discussed dietary modification and lifestyle intervention including reduce carbohydrate intake and increase activity as tolerated.

## 2020-07-20 NOTE — Assessment & Plan Note (Signed)
Clinically, he has no signs of disease progression. He will remain at stage 0. ?I will see him next year in 12 months with history, physical examination and blood work. ?

## 2020-07-20 NOTE — Assessment & Plan Note (Signed)
Likely solar keratosis. ?I recommend avoidance of excessive sun exposure and to wears sunscreen regularly. ?

## 2020-07-20 NOTE — Telephone Encounter (Signed)
Scheduled per los. Gave avs and calendar  

## 2020-07-20 NOTE — Progress Notes (Signed)
[place] OFFICE PROGRESS NOTE  Patient Care Team: Jilda Panda, MD as PCP - General (Internal Medicine)  ASSESSMENT & PLAN:  CLL (chronic lymphocytic leukemia) Clinically, he has no signs of disease progression. He will remain at stage 0. I will see him next year in 12 months with history, physical examination and blood work.  Obesity (BMI 30-39.9) The patient has gained a lot of weight We discussed dietary modification and lifestyle intervention including reduce carbohydrate intake and increase activity as tolerated.  Skin lesions, generalized Likely solar keratosis. I recommend avoidance of excessive sun exposure and to wears sunscreen regularly.   Orders Placed This Encounter  Procedures  . CBC with Differential/Platelet    Standing Status:   Standing    Number of Occurrences:   22    Standing Expiration Date:   07/20/2021    All questions were answered. The patient knows to call the clinic with any problems, questions or concerns. The total time spent in the appointment was 20 minutes encounter with patients including review of chart and various tests results, discussions about plan of care and coordination of care plan   Heath Lark, MD 07/20/2020 2:33 PM  INTERVAL HISTORY: Please see below for problem oriented charting. He returns for follow-up for CLL He feels well No new lymphadenopathy Denies recent infection, fever or chills No abnormal night sweats or weight loss In fact, he continues to gain weight since her last visit  SUMMARY OF ONCOLOGIC HISTORY: Oncology History  CLL (chronic lymphocytic leukemia) ([place])  08/30/2013 Initial Diagnosis   CLL (chronic lymphocytic leukemia)   02/15/2014 Pathology Results   FISH for CLL was negative   07/20/2020 Cancer Staging   Staging form: Chronic Lymphocytic Leukemia / Small Lymphocytic Lymphoma, AJCC 8th Edition - Clinical stage from 07/20/2020: Modified Rai Stage 0 (Modified Rai risk: Low, Lymphocytosis:  Present, Adenopathy: Absent, Organomegaly: Absent, Anemia: Absent, Thrombocytopenia: Absent) - Signed by Heath Lark, MD on 07/20/2020     REVIEW OF SYSTEMS:   Constitutional: Denies fevers, chills or abnormal weight loss Eyes: Denies blurriness of vision Ears, nose, mouth, throat, and face: Denies mucositis or sore throat Respiratory: Denies cough, dyspnea or wheezes Cardiovascular: Denies palpitation, chest discomfort or lower extremity swelling Gastrointestinal:  Denies nausea, heartburn or change in bowel habits Skin: Denies abnormal skin rashes Lymphatics: Denies new lymphadenopathy or easy bruising Neurological:Denies numbness, tingling or new weaknesses Behavioral/Psych: Mood is stable, no new changes  All other systems were reviewed with the patient and are negative.  I have reviewed the past medical history, past surgical history, social history and family history with the patient and they are unchanged from previous note.  ALLERGIES:  has No Known Allergies.  MEDICATIONS:  Current Outpatient Medications  Medication Sig Dispense Refill  . allopurinol (ZYLOPRIM) 100 MG tablet Take 100 mg by mouth daily.  6  . Cholecalciferol (VITAMIN D-1000 MAX ST) 1000 units tablet Take 1,000 Units by mouth daily.    Marland Kitchen losartan (COZAAR) 100 MG tablet Take 100 mg by mouth daily.  6  . metoprolol tartrate (LOPRESSOR) 25 MG tablet Take 25 mg by mouth 2 (two) times daily.  6  . Multiple Vitamin (MULTIVITAMIN) capsule Take 1 capsule by mouth daily.    . vitamin B-12 (CYANOCOBALAMIN) 1000 MCG tablet Take 1,000 mcg by mouth daily.    . vitamin C (ASCORBIC ACID) 250 MG tablet Take 250 mg by mouth daily. Takes 240 mg     No current facility-administered medications for this  visit.    PHYSICAL EXAMINATION: ECOG PERFORMANCE STATUS: 1 - Symptomatic but completely ambulatory  Vitals:   07/20/20 1147  BP: (!) 143/70  Pulse: 61  Resp: 18  Temp: 97.6 F (36.4 C)  SpO2: 99%   Filed Weights    07/20/20 1147  Weight: 204 lb 12.8 oz (92.9 kg)    GENERAL:alert, no distress and comfortable SKIN: Noted significant solar keratosis EYES: normal, Conjunctiva are pink and non-injected, sclera clear OROPHARYNX:no exudate, no erythema and lips, buccal mucosa, and tongue normal  NECK: supple, thyroid normal size, non-tender, without nodularity LYMPH:  no palpable lymphadenopathy in the cervical, axillary or inguinal LUNGS: clear to auscultation and percussion with normal breathing effort HEART: regular rate & rhythm and no murmurs and no lower extremity edema ABDOMEN:abdomen soft, non-tender and normal bowel sounds Musculoskeletal:no cyanosis of digits and no clubbing  NEURO: alert & oriented x 3 with fluent speech, no focal motor/sensory deficits  LABORATORY DATA:  I have reviewed the data as listed    Component Value Date/Time   NA 138 11/21/2016 1408   K 4.3 11/21/2016 1408   CO2 27 11/21/2016 1408   GLUCOSE 107 11/21/2016 1408   BUN 18.0 11/21/2016 1408   CREATININE 1.1 11/21/2016 1408   CALCIUM 10.5 (H) 11/21/2016 1408   PROT 8.1 11/21/2016 1408   ALBUMIN 4.4 11/21/2016 1408   AST 20 11/21/2016 1408   ALT 16 11/21/2016 1408   ALKPHOS 58 11/21/2016 1408   BILITOT 0.48 11/21/2016 1408    No results found for: SPEP, UPEP  Lab Results  Component Value Date   WBC 24.6 (H) 07/20/2020   NEUTROABS 2.7 07/20/2020   HGB 13.8 07/20/2020   HCT 42.6 07/20/2020   MCV 94.2 07/20/2020   PLT 270 07/20/2020      Chemistry      Component Value Date/Time   NA 138 11/21/2016 1408   K 4.3 11/21/2016 1408   CO2 27 11/21/2016 1408   BUN 18.0 11/21/2016 1408   CREATININE 1.1 11/21/2016 1408      Component Value Date/Time   CALCIUM 10.5 (H) 11/21/2016 1408   ALKPHOS 58 11/21/2016 1408   AST 20 11/21/2016 1408   ALT 16 11/21/2016 1408   BILITOT 0.48 11/21/2016 1408

## 2021-07-19 ENCOUNTER — Inpatient Hospital Stay: Payer: No Typology Code available for payment source

## 2021-07-19 ENCOUNTER — Encounter: Payer: Self-pay | Admitting: Hematology and Oncology

## 2021-07-19 ENCOUNTER — Other Ambulatory Visit: Payer: Self-pay

## 2021-07-19 ENCOUNTER — Inpatient Hospital Stay
Payer: No Typology Code available for payment source | Attending: Hematology and Oncology | Admitting: Hematology and Oncology

## 2021-07-19 DIAGNOSIS — C9110 Chronic lymphocytic leukemia of B-cell type not having achieved remission: Secondary | ICD-10-CM | POA: Diagnosis present

## 2021-07-19 DIAGNOSIS — R5383 Other fatigue: Secondary | ICD-10-CM | POA: Diagnosis not present

## 2021-07-19 DIAGNOSIS — Z79899 Other long term (current) drug therapy: Secondary | ICD-10-CM | POA: Diagnosis not present

## 2021-07-19 DIAGNOSIS — E669 Obesity, unspecified: Secondary | ICD-10-CM | POA: Diagnosis not present

## 2021-07-19 DIAGNOSIS — L989 Disorder of the skin and subcutaneous tissue, unspecified: Secondary | ICD-10-CM

## 2021-07-19 LAB — CBC WITH DIFFERENTIAL/PLATELET
Abs Immature Granulocytes: 0.04 10*3/uL (ref 0.00–0.07)
Basophils Absolute: 0.1 10*3/uL (ref 0.0–0.1)
Basophils Relative: 0 %
Eosinophils Absolute: 0.1 10*3/uL (ref 0.0–0.5)
Eosinophils Relative: 1 %
HCT: 41.1 % (ref 39.0–52.0)
Hemoglobin: 14 g/dL (ref 13.0–17.0)
Immature Granulocytes: 0 %
Lymphocytes Relative: 84 %
Lymphs Abs: 19.6 10*3/uL — ABNORMAL HIGH (ref 0.7–4.0)
MCH: 31.3 pg (ref 26.0–34.0)
MCHC: 34.1 g/dL (ref 30.0–36.0)
MCV: 91.7 fL (ref 80.0–100.0)
Monocytes Absolute: 0.7 10*3/uL (ref 0.1–1.0)
Monocytes Relative: 3 %
Neutro Abs: 2.8 10*3/uL (ref 1.7–7.7)
Neutrophils Relative %: 12 %
Platelets: 236 10*3/uL (ref 150–400)
RBC: 4.48 MIL/uL (ref 4.22–5.81)
RDW: 13.2 % (ref 11.5–15.5)
Smear Review: NORMAL
WBC: 23.4 10*3/uL — ABNORMAL HIGH (ref 4.0–10.5)
nRBC: 0 % (ref 0.0–0.2)

## 2021-07-19 NOTE — Assessment & Plan Note (Signed)
The patient has started on medication to help with weight loss ?We discussed dietary modification and lifestyle intervention including reduce carbohydrate intake and increase activity as tolerated. ?

## 2021-07-19 NOTE — Progress Notes (Signed)
[place] ?OFFICE PROGRESS NOTE ? ?Patient Care Team: ?Jilda Panda, MD as PCP - General (Internal Medicine) ? ?ASSESSMENT & PLAN:  ?CLL (chronic lymphocytic leukemia) ?Clinically, he has no signs of disease progression. He will remain at stage 0. ?I will see him next year in 12 months with history, physical examination and blood work. ? ?Skin lesions, generalized ?Likely solar keratosis. ?I recommend avoidance of excessive sun exposure and to wears sunscreen regularly. ? ?Obesity (BMI 30-39.9) ?The patient has started on medication to help with weight loss ?We discussed dietary modification and lifestyle intervention including reduce carbohydrate intake and increase activity as tolerated. ? ?No orders of the defined types were placed in this encounter. ? ? ?All questions were answered. The patient knows to call the clinic with any problems, questions or concerns. ?The total time spent in the appointment was 20 minutes encounter with patients including review of chart and various tests results, discussions about plan of care and coordination of care plan ?  ?[name] Lark, MD ?07/19/2021 1:37 PM ? ?INTERVAL HISTORY: ?Please see below for problem oriented charting. ?he returns for surveillance follow-up for CLL ?He is doing well ?Denies recent new lymphadenopathy ?No recent infection ?He complain of fatigue but admits he is not getting enough sleep ?He recently saw dermatologist but no abnormal skin cancers detected ? ?REVIEW OF SYSTEMS:   ?Constitutional: Denies fevers, chills or abnormal weight loss ?Eyes: Denies blurriness of vision ?Ears, nose, mouth, throat, and face: Denies mucositis or sore throat ?Respiratory: Denies cough, dyspnea or wheezes ?Cardiovascular: Denies palpitation, chest discomfort or lower extremity swelling ?Lymphatics: Denies new lymphadenopathy or easy bruising ?Neurological:Denies numbness, tingling or new weaknesses ?Behavioral/Psych: Mood is stable, no new changes  ?All other  systems were reviewed with the patient and are negative. ? ?I have reviewed the past medical history, past surgical history, social history and family history with the patient and they are unchanged from previous note. ? ?ALLERGIES:  has No Known Allergies. ? ?MEDICATIONS:  ?Current Outpatient Medications  ?Medication Sig Dispense Refill  ? amLODipine (NORVASC) 2.5 MG tablet Take 2.5 mg by mouth daily.    ? candesartan (ATACAND) 32 MG tablet Take 32 mg by mouth daily.    ? Semaglutide (RYBELSUS) 3 MG TABS     ? allopurinol (ZYLOPRIM) 100 MG tablet Take 100 mg by mouth daily.  6  ? Cholecalciferol (VITAMIN D-1000 MAX ST) 1000 units tablet Take 1,000 Units by mouth daily.    ? metoprolol tartrate (LOPRESSOR) 25 MG tablet Take 25 mg by mouth 2 (two) times daily.  6  ? Multiple Vitamin (MULTIVITAMIN) capsule Take 1 capsule by mouth daily.    ? vitamin B-12 (CYANOCOBALAMIN) 1000 MCG tablet Take 1,000 mcg by mouth daily.    ? vitamin C (ASCORBIC ACID) 250 MG tablet Take 250 mg by mouth daily. Takes 240 mg    ? ?No current facility-administered medications for this visit.  ? ? ?SUMMARY OF ONCOLOGIC HISTORY: ?Oncology History  ?CLL (chronic lymphocytic leukemia) ([place])  ?08/30/2013 Initial Diagnosis  ? CLL (chronic lymphocytic leukemia) ? ?  ?02/15/2014 Pathology Results  ? FISH for CLL was negative ? ?  ?07/20/2020 Cancer Staging  ? Staging form: Chronic Lymphocytic Leukemia / Small Lymphocytic Lymphoma, AJCC 8th Edition ?- Clinical stage from 07/20/2020: Modified Rai Stage 0 (Modified Rai risk: Low, Lymphocytosis: Present, Adenopathy: Absent, Organomegaly: Absent, Anemia: Absent, Thrombocytopenia: Absent) - Signed by [name] Lark, MD on 07/20/2020 ? ?  ? ? ?PHYSICAL EXAMINATION: ?ECOG PERFORMANCE  STATUS: 1 - Symptomatic but completely ambulatory ? ?Vitals:  ? 07/19/21 1207  ?BP: 139/73  ?Pulse: 69  ?Resp: 18  ?Temp: (!) 97.4 ?F (36.3 ?C)  ?SpO2: 98%  ? ?Filed Weights  ? 07/19/21 1207  ?Weight: 202 lb 6.4 oz (91.8 kg)   ? ? ?GENERAL:alert, no distress and comfortable ?SKIN: Noted diffuse seborrheic keratosis ?EYES: normal, Conjunctiva are pink and non-injected, sclera clear ?OROPHARYNX:no exudate, no erythema and lips, buccal mucosa, and tongue normal  ?NECK: supple, thyroid normal size, non-tender, without nodularity ?LYMPH:  no palpable lymphadenopathy in the cervical, axillary or inguinal ?LUNGS: clear to auscultation and percussion with normal breathing effort ?HEART: regular rate & rhythm and no murmurs and no lower extremity edema ?ABDOMEN:abdomen soft, non-tender and normal bowel sounds.  Central obesity is noted ?Musculoskeletal:no cyanosis of digits and no clubbing  ?NEURO: alert & oriented x 3 with fluent speech, no focal motor/sensory deficits ? ?LABORATORY DATA:  ?I have reviewed the data as listed ?   ?Component Value Date/Time  ? NA 138 11/21/2016 1408  ? K 4.3 11/21/2016 1408  ? CO2 27 11/21/2016 1408  ? GLUCOSE 107 11/21/2016 1408  ? BUN 18.0 11/21/2016 1408  ? CREATININE 1.1 11/21/2016 1408  ? CALCIUM 10.5 (H) 11/21/2016 1408  ? PROT 8.1 11/21/2016 1408  ? ALBUMIN 4.4 11/21/2016 1408  ? AST 20 11/21/2016 1408  ? ALT 16 11/21/2016 1408  ? ALKPHOS 58 11/21/2016 1408  ? BILITOT 0.48 11/21/2016 1408  ? ? ?No results found for: SPEP, UPEP ? ?Lab Results  ?Component Value Date  ? WBC 23.4 (H) 07/19/2021  ? NEUTROABS 2.8 07/19/2021  ? HGB 14.0 07/19/2021  ? HCT 41.1 07/19/2021  ? MCV 91.7 07/19/2021  ? PLT 236 07/19/2021  ? ? ?  Chemistry   ?   ?Component Value Date/Time  ? NA 138 11/21/2016 1408  ? K 4.3 11/21/2016 1408  ? CO2 27 11/21/2016 1408  ? BUN 18.0 11/21/2016 1408  ? CREATININE 1.1 11/21/2016 1408  ?    ?Component Value Date/Time  ? CALCIUM 10.5 (H) 11/21/2016 1408  ? ALKPHOS 58 11/21/2016 1408  ? AST 20 11/21/2016 1408  ? ALT 16 11/21/2016 1408  ? BILITOT 0.48 11/21/2016 1408  ?  ? ?

## 2021-07-19 NOTE — Assessment & Plan Note (Signed)
Clinically, he has no signs of disease progression. He will remain at stage 0. ?I will see him next year in 12 months with history, physical examination and blood work. ?

## 2021-07-19 NOTE — Assessment & Plan Note (Signed)
Likely solar keratosis. ?I recommend avoidance of excessive sun exposure and to wears sunscreen regularly. ?

## 2022-04-11 ENCOUNTER — Encounter: Payer: Self-pay | Admitting: Hematology and Oncology

## 2022-07-25 ENCOUNTER — Inpatient Hospital Stay: Payer: Medicare Other | Attending: Hematology and Oncology

## 2022-07-25 ENCOUNTER — Inpatient Hospital Stay (HOSPITAL_BASED_OUTPATIENT_CLINIC_OR_DEPARTMENT_OTHER): Payer: Medicare Other | Admitting: Hematology and Oncology

## 2022-07-25 ENCOUNTER — Encounter: Payer: Self-pay | Admitting: Hematology and Oncology

## 2022-07-25 VITALS — BP 141/75 | HR 89 | Temp 98.5°F | Resp 18 | Ht 66.0 in | Wt 207.6 lb

## 2022-07-25 DIAGNOSIS — Z79899 Other long term (current) drug therapy: Secondary | ICD-10-CM | POA: Diagnosis not present

## 2022-07-25 DIAGNOSIS — C9110 Chronic lymphocytic leukemia of B-cell type not having achieved remission: Secondary | ICD-10-CM

## 2022-07-25 DIAGNOSIS — L989 Disorder of the skin and subcutaneous tissue, unspecified: Secondary | ICD-10-CM | POA: Diagnosis not present

## 2022-07-25 LAB — CBC WITH DIFFERENTIAL/PLATELET
Abs Immature Granulocytes: 0.03 10*3/uL (ref 0.00–0.07)
Basophils Absolute: 0.1 10*3/uL (ref 0.0–0.1)
Basophils Relative: 0 %
Eosinophils Absolute: 0.1 10*3/uL (ref 0.0–0.5)
Eosinophils Relative: 1 %
HCT: 43 % (ref 39.0–52.0)
Hemoglobin: 14.1 g/dL (ref 13.0–17.0)
Immature Granulocytes: 0 %
Lymphocytes Relative: 81 %
Lymphs Abs: 17.2 10*3/uL — ABNORMAL HIGH (ref 0.7–4.0)
MCH: 30.3 pg (ref 26.0–34.0)
MCHC: 32.8 g/dL (ref 30.0–36.0)
MCV: 92.5 fL (ref 80.0–100.0)
Monocytes Absolute: 0.8 10*3/uL (ref 0.1–1.0)
Monocytes Relative: 4 %
Neutro Abs: 3 10*3/uL (ref 1.7–7.7)
Neutrophils Relative %: 14 %
Platelets: 255 10*3/uL (ref 150–400)
RBC: 4.65 MIL/uL (ref 4.22–5.81)
RDW: 13.2 % (ref 11.5–15.5)
Smear Review: NORMAL
WBC: 21.1 10*3/uL — ABNORMAL HIGH (ref 4.0–10.5)
nRBC: 0 % (ref 0.0–0.2)

## 2022-07-25 NOTE — Assessment & Plan Note (Signed)
Likely solar keratosis. I recommend avoidance of excessive sun exposure and to wears sunscreen regularly. 

## 2022-07-25 NOTE — Progress Notes (Signed)
[facility] OFFICE PROGRESS NOTE  Patient Care Team: Ralene Ok, MD as PCP - General (Internal Medicine)  ASSESSMENT & PLAN:  CLL (chronic lymphocytic leukemia) Clinically, he has no signs of disease progression. He will remain at stage 0. Examination is benign I will see him next year in 12 months with history, physical examination and blood work.  Skin lesions, generalized Likely solar keratosis. I recommend avoidance of excessive sun exposure and to wears sunscreen regularly.  No orders of the defined types were placed in this encounter.   All questions were answered. The patient knows to call the clinic with any problems, questions or concerns. The total time spent in the appointment was 20 minutes encounter with patients including review of chart and various tests results, discussions about plan of care and coordination of care plan   Artis Delay, MD 07/25/2022 2:06 PM  INTERVAL HISTORY: Please see below for problem oriented charting. he returns for surveillance follow-up for CLL He is doing well He is attempting to lose weight No new lymphadenopathy or recent infection  REVIEW OF SYSTEMS:   Constitutional: Denies fevers, chills or abnormal weight loss Eyes: Denies blurriness of vision Ears, nose, mouth, throat, and face: Denies mucositis or sore throat Respiratory: Denies cough, dyspnea or wheezes Cardiovascular: Denies palpitation, chest discomfort or lower extremity swelling Gastrointestinal:  Denies nausea, heartburn or change in bowel habits Skin: Denies abnormal skin rashes Lymphatics: Denies new lymphadenopathy or easy bruising Neurological:Denies numbness, tingling or new weaknesses Behavioral/Psych: Mood is stable, no new changes  All other systems were reviewed with the patient and are negative.  I have reviewed the past medical history, past surgical history, social history and family history with the patient and they are unchanged from  previous note.  ALLERGIES:  has No Known Allergies.  MEDICATIONS:  Current Outpatient Medications  Medication Sig Dispense Refill   allopurinol (ZYLOPRIM) 100 MG tablet Take 100 mg by mouth daily.  6   amLODipine (NORVASC) 2.5 MG tablet Take 2.5 mg by mouth daily.     candesartan (ATACAND) 32 MG tablet Take 32 mg by mouth daily.     Cholecalciferol (VITAMIN D-1000 MAX ST) 1000 units tablet Take 1,000 Units by mouth daily.     metoprolol tartrate (LOPRESSOR) 25 MG tablet Take 25 mg by mouth 2 (two) times daily.  6   Multiple Vitamin (MULTIVITAMIN) capsule Take 1 capsule by mouth daily.     vitamin B-12 (CYANOCOBALAMIN) 1000 MCG tablet Take 1,000 mcg by mouth daily.     vitamin C (ASCORBIC ACID) 250 MG tablet Take 250 mg by mouth daily. Takes 240 mg     No current facility-administered medications for this visit.    SUMMARY OF ONCOLOGIC HISTORY: Oncology History  CLL (chronic lymphocytic leukemia) ([facility])  08/30/2013 Initial Diagnosis   CLL (chronic lymphocytic leukemia)   02/15/2014 Pathology Results   FISH for CLL was negative   07/20/2020 Cancer Staging   Staging form: Chronic Lymphocytic Leukemia / Small Lymphocytic Lymphoma, AJCC 8th Edition - Clinical stage from 07/20/2020: Modified Rai Stage 0 (Modified Rai risk: Low, Lymphocytosis: Present, Adenopathy: Absent, Organomegaly: Absent, Anemia: Absent, Thrombocytopenia: Absent) - Signed by Artis Delay, MD on 07/20/2020     PHYSICAL EXAMINATION: ECOG PERFORMANCE STATUS: 0 - Asymptomatic  Vitals:   07/25/22 1203  BP: (!) 141/75  Pulse: 89  Resp: 18  Temp: 98.5 F (36.9 C)  SpO2: 97%   Filed Weights   07/25/22 1203  Weight: 207 lb 9.6 oz (  94.2 kg)    GENERAL:alert, no distress and comfortable SKIN: Noted diffuse solar keratosis EYES: normal, Conjunctiva are pink and non-injected, sclera clear OROPHARYNX:no exudate, no erythema and lips, buccal mucosa, and tongue normal  NECK: supple, thyroid normal size, non-tender,  without nodularity LYMPH:  no palpable lymphadenopathy in the cervical, axillary or inguinal LUNGS: clear to auscultation and percussion with normal breathing effort HEART: regular rate & rhythm and no murmurs and no lower extremity edema ABDOMEN:abdomen soft, non-tender and normal bowel sounds Musculoskeletal:no cyanosis of digits and no clubbing  NEURO: alert & oriented x 3 with fluent speech, no focal motor/sensory deficits  LABORATORY DATA:  I have reviewed the data as listed    Component Value Date/Time   NA 138 11/21/2016 1408   K 4.3 11/21/2016 1408   CO2 27 11/21/2016 1408   GLUCOSE 107 11/21/2016 1408   BUN 18.0 11/21/2016 1408   CREATININE 1.1 11/21/2016 1408   CALCIUM 10.5 (H) 11/21/2016 1408   PROT 8.1 11/21/2016 1408   ALBUMIN 4.4 11/21/2016 1408   AST 20 11/21/2016 1408   ALT 16 11/21/2016 1408   ALKPHOS 58 11/21/2016 1408   BILITOT 0.48 11/21/2016 1408    No results found for: "SPEP", "UPEP"  Lab Results  Component Value Date   WBC 21.1 (H) 07/25/2022   NEUTROABS 3.0 07/25/2022   HGB 14.1 07/25/2022   HCT 43.0 07/25/2022   MCV 92.5 07/25/2022   PLT 255 07/25/2022      Chemistry      Component Value Date/Time   NA 138 11/21/2016 1408   K 4.3 11/21/2016 1408   CO2 27 11/21/2016 1408   BUN 18.0 11/21/2016 1408   CREATININE 1.1 11/21/2016 1408      Component Value Date/Time   CALCIUM 10.5 (H) 11/21/2016 1408   ALKPHOS 58 11/21/2016 1408   AST 20 11/21/2016 1408   ALT 16 11/21/2016 1408   BILITOT 0.48 11/21/2016 1408

## 2022-07-25 NOTE — Assessment & Plan Note (Signed)
Clinically, he has no signs of disease progression. He will remain at stage 0. Examination is benign I will see him next year in 12 months with history, physical examination and blood work.

## 2023-07-29 ENCOUNTER — Inpatient Hospital Stay: Payer: Medicare Other

## 2023-07-29 ENCOUNTER — Encounter: Payer: Self-pay | Admitting: Hematology and Oncology

## 2023-07-29 ENCOUNTER — Inpatient Hospital Stay: Payer: Medicare Other | Attending: Hematology and Oncology | Admitting: Hematology and Oncology

## 2023-10-26 LAB — COLOGUARD: COLOGUARD: NEGATIVE

## 2024-03-15 ENCOUNTER — Telehealth: Payer: Self-pay

## 2024-03-15 NOTE — Telephone Encounter (Signed)
 Called and left a message. He was a no show for appt on 07/29/23. Left a message asking if he wants to schedule appt with Dr. Lonn.

## 2024-03-18 ENCOUNTER — Other Ambulatory Visit: Payer: Self-pay | Admitting: Hematology and Oncology

## 2024-03-18 ENCOUNTER — Telehealth: Payer: Self-pay

## 2024-03-18 DIAGNOSIS — C9110 Chronic lymphocytic leukemia of B-cell type not having achieved remission: Secondary | ICD-10-CM

## 2024-03-18 NOTE — Telephone Encounter (Signed)
 Returned his call. Scheduled appt on 2/10. He is aware of appt date/time.

## 2024-04-20 ENCOUNTER — Inpatient Hospital Stay: Admitting: Hematology and Oncology

## 2024-04-20 ENCOUNTER — Inpatient Hospital Stay
